# Patient Record
Sex: Female | Born: 1950 | Race: White | Hispanic: No | Marital: Single | Smoking: Never smoker
Health system: Southern US, Community
[De-identification: ages and names within clinical notes are randomized; demographics above are authoritative.]

## PROBLEM LIST (undated history)

## (undated) DIAGNOSIS — E079 Disorder of thyroid, unspecified: Secondary | ICD-10-CM

## (undated) DIAGNOSIS — Z9889 Other specified postprocedural states: Secondary | ICD-10-CM

## (undated) DIAGNOSIS — K635 Polyp of colon: Secondary | ICD-10-CM

## (undated) DIAGNOSIS — F329 Major depressive disorder, single episode, unspecified: Secondary | ICD-10-CM

## (undated) DIAGNOSIS — E039 Hypothyroidism, unspecified: Secondary | ICD-10-CM

## (undated) DIAGNOSIS — K219 Gastro-esophageal reflux disease without esophagitis: Secondary | ICD-10-CM

## (undated) DIAGNOSIS — E789 Disorder of lipoprotein metabolism, unspecified: Secondary | ICD-10-CM

## (undated) DIAGNOSIS — R112 Nausea with vomiting, unspecified: Secondary | ICD-10-CM

## (undated) DIAGNOSIS — F419 Anxiety disorder, unspecified: Secondary | ICD-10-CM

## (undated) DIAGNOSIS — T7840XA Allergy, unspecified, initial encounter: Secondary | ICD-10-CM

## (undated) DIAGNOSIS — E7800 Pure hypercholesterolemia, unspecified: Secondary | ICD-10-CM

## (undated) DIAGNOSIS — K220 Achalasia of cardia: Secondary | ICD-10-CM

## (undated) DIAGNOSIS — F32A Depression, unspecified: Secondary | ICD-10-CM

## (undated) HISTORY — PX: BREAST SURGERY: SHX581

## (undated) HISTORY — DX: Disorder of thyroid, unspecified: E07.9

## (undated) HISTORY — DX: Polyp of colon: K63.5

## (undated) HISTORY — PX: BREAST EXCISIONAL BIOPSY: SUR124

## (undated) HISTORY — DX: Pure hypercholesterolemia, unspecified: E78.00

## (undated) HISTORY — PX: OTHER SURGICAL HISTORY: SHX169

## (undated) HISTORY — PX: EYE SURGERY: SHX253

## (undated) HISTORY — DX: Depression, unspecified: F32.A

## (undated) HISTORY — DX: Anxiety disorder, unspecified: F41.9

## (undated) HISTORY — DX: Achalasia of cardia: K22.0

## (undated) HISTORY — PX: WISDOM TOOTH EXTRACTION: SHX21

## (undated) HISTORY — DX: Allergy, unspecified, initial encounter: T78.40XA

## (undated) HISTORY — DX: Major depressive disorder, single episode, unspecified: F32.9

## (undated) HISTORY — DX: Disorder of lipoprotein metabolism, unspecified: E78.9

## (undated) HISTORY — PX: ESOPHAGOGASTRODUODENOSCOPY (EGD) WITH ESOPHAGEAL DILATION: SHX5812

---

## 1999-03-08 ENCOUNTER — Other Ambulatory Visit: Admission: RE | Admit: 1999-03-08 | Discharge: 1999-03-08 | Payer: Self-pay | Admitting: Internal Medicine

## 2000-07-22 ENCOUNTER — Other Ambulatory Visit: Admission: RE | Admit: 2000-07-22 | Discharge: 2000-07-22 | Payer: Self-pay | Admitting: Obstetrics and Gynecology

## 2001-09-30 ENCOUNTER — Other Ambulatory Visit: Admission: RE | Admit: 2001-09-30 | Discharge: 2001-09-30 | Payer: Self-pay | Admitting: Obstetrics and Gynecology

## 2002-10-29 ENCOUNTER — Other Ambulatory Visit: Admission: RE | Admit: 2002-10-29 | Discharge: 2002-10-29 | Payer: Self-pay | Admitting: Obstetrics and Gynecology

## 2004-02-21 ENCOUNTER — Other Ambulatory Visit: Admission: RE | Admit: 2004-02-21 | Discharge: 2004-02-21 | Payer: Self-pay | Admitting: Obstetrics and Gynecology

## 2004-04-11 ENCOUNTER — Ambulatory Visit (HOSPITAL_COMMUNITY): Admission: RE | Admit: 2004-04-11 | Discharge: 2004-04-11 | Payer: Self-pay | Admitting: Obstetrics and Gynecology

## 2004-04-11 ENCOUNTER — Ambulatory Visit (HOSPITAL_BASED_OUTPATIENT_CLINIC_OR_DEPARTMENT_OTHER): Admission: RE | Admit: 2004-04-11 | Discharge: 2004-04-11 | Payer: Self-pay | Admitting: Obstetrics and Gynecology

## 2004-04-11 ENCOUNTER — Encounter (INDEPENDENT_AMBULATORY_CARE_PROVIDER_SITE_OTHER): Payer: Self-pay | Admitting: *Deleted

## 2004-08-14 ENCOUNTER — Encounter: Admission: RE | Admit: 2004-08-14 | Discharge: 2004-08-14 | Payer: Self-pay | Admitting: Internal Medicine

## 2005-07-12 ENCOUNTER — Encounter: Admission: RE | Admit: 2005-07-12 | Discharge: 2005-07-12 | Payer: Self-pay | Admitting: Obstetrics and Gynecology

## 2005-07-31 ENCOUNTER — Encounter: Admission: RE | Admit: 2005-07-31 | Discharge: 2005-07-31 | Payer: Self-pay | Admitting: Obstetrics and Gynecology

## 2005-08-21 ENCOUNTER — Encounter: Admission: RE | Admit: 2005-08-21 | Discharge: 2005-08-21 | Payer: Self-pay | Admitting: Obstetrics and Gynecology

## 2006-01-22 ENCOUNTER — Other Ambulatory Visit: Admission: RE | Admit: 2006-01-22 | Discharge: 2006-01-22 | Payer: Self-pay | Admitting: Internal Medicine

## 2006-02-06 ENCOUNTER — Encounter: Admission: RE | Admit: 2006-02-06 | Discharge: 2006-02-06 | Payer: Self-pay | Admitting: Internal Medicine

## 2006-02-11 ENCOUNTER — Encounter: Admission: RE | Admit: 2006-02-11 | Discharge: 2006-02-11 | Payer: Self-pay | Admitting: Gastroenterology

## 2006-03-18 ENCOUNTER — Ambulatory Visit (HOSPITAL_COMMUNITY): Admission: RE | Admit: 2006-03-18 | Discharge: 2006-03-18 | Payer: Self-pay | Admitting: Gastroenterology

## 2006-04-17 ENCOUNTER — Encounter (INDEPENDENT_AMBULATORY_CARE_PROVIDER_SITE_OTHER): Payer: Self-pay | Admitting: Specialist

## 2006-04-17 ENCOUNTER — Encounter: Admission: RE | Admit: 2006-04-17 | Discharge: 2006-04-17 | Payer: Self-pay | Admitting: Internal Medicine

## 2006-04-17 ENCOUNTER — Other Ambulatory Visit: Admission: RE | Admit: 2006-04-17 | Discharge: 2006-04-17 | Payer: Self-pay | Admitting: Diagnostic Radiology

## 2007-01-20 ENCOUNTER — Encounter: Admission: RE | Admit: 2007-01-20 | Discharge: 2007-01-20 | Payer: Self-pay | Admitting: Family Medicine

## 2007-02-06 ENCOUNTER — Encounter: Admission: RE | Admit: 2007-02-06 | Discharge: 2007-02-06 | Payer: Self-pay | Admitting: Family Medicine

## 2008-05-21 ENCOUNTER — Encounter: Admission: RE | Admit: 2008-05-21 | Discharge: 2008-05-21 | Payer: Self-pay | Admitting: Family Medicine

## 2008-05-27 ENCOUNTER — Other Ambulatory Visit: Admission: RE | Admit: 2008-05-27 | Discharge: 2008-05-27 | Payer: Self-pay | Admitting: Family Medicine

## 2009-09-05 ENCOUNTER — Encounter: Admission: RE | Admit: 2009-09-05 | Discharge: 2009-09-05 | Payer: Self-pay | Admitting: Family Medicine

## 2010-10-01 ENCOUNTER — Encounter: Payer: Self-pay | Admitting: Internal Medicine

## 2010-11-06 ENCOUNTER — Ambulatory Visit (INDEPENDENT_AMBULATORY_CARE_PROVIDER_SITE_OTHER): Payer: BC Managed Care – PPO | Admitting: Family Medicine

## 2010-11-06 DIAGNOSIS — E039 Hypothyroidism, unspecified: Secondary | ICD-10-CM

## 2010-11-06 DIAGNOSIS — Z79899 Other long term (current) drug therapy: Secondary | ICD-10-CM

## 2010-11-06 DIAGNOSIS — R5381 Other malaise: Secondary | ICD-10-CM

## 2010-11-06 DIAGNOSIS — E7800 Pure hypercholesterolemia, unspecified: Secondary | ICD-10-CM

## 2010-11-06 DIAGNOSIS — Z23 Encounter for immunization: Secondary | ICD-10-CM

## 2011-01-26 NOTE — Op Note (Signed)
NAMEKHAMILA, Cunningham                         ACCOUNT NO.:  [id number]   MEDICAL RECORD NO.:  [id number]                   PATIENT TYPE:  AMB   LOCATION:  [facility]                                 FACILITY:  [facility]   PHYSICIAN:  Laqueta Linden, M.D.                 DATE OF BIRTH:  1951-06-07   DATE OF PROCEDURE:  04/11/2004  DATE OF DISCHARGE:                                 OPERATIVE REPORT   PREOPERATIVE DIAGNOSES:  Perimenopausal menorrhagia, submucosal intramural  and serosal fibroids.   POSTOPERATIVE DIAGNOSES:  Perimenopausal menorrhagia, submucosal intramural  and serosal fibroids.   PROCEDURE:  Hydrothermal ablation with endometrial curettage.   SURGEON:  Laqueta Linden, M.D.   ANESTHESIA:  General LMA.   ESTIMATED BLOOD LOSS:  Less than 30 mL.   SORBITOL NET INTAKE:  Zero.   SPECIMENS:  Endometrial curettings sent to pathology.   COMPLICATIONS:  None.   INDICATIONS FOR PROCEDURE:  Olivia Cunningham is a 60 year old, nulligravida,  perimenopausal female who has done well on low dose birth control pills  until the past year when she reported that her withdrawal bleeds have become  progressively heavier with worsening clots and cramping, change a tampon and  pad every hour for up to 3-4 days each cycle. She has also had some  breakthrough bleeding up to seven days per cycle. She underwent office  evaluation which included endometrial sampling which revealed endometrial  polyp and weekly secretory benign endometrium.  Pelvis ultrasound with  sonohysterogram revealed an enlarged uterus with multiple serosal and  intramural fibroids with the largest measuring 3.7 x 3.9 cm, both ovaries  were normal.  Sonohysterogram confirmed the presence of two submucosal  fibroids, an anterior 7 x 5 mm and a posterior 2.1 x 1.7 cm posterior fundal  submucosal fibroid that appeared to be 50% submucosal. Of note, the  endometrial stripe was thin at 2.8 mm consistent with her oral contraceptive  use. At this point, she had begun taking her pills twice daily in order to  stop the perfuse breakthrough bleeding. Due to her endometrium already being  appropriately thin, she was felt to be a good candidate for a hydrothermal  ablation procedure.  Other options including hysterectomy versus  hysteroscopic resection of the fibroids were discussed with the patient. She  elected to proceed with HTA with curettage as indicated. She has seen the  operative hysteroscopy informed consent films, understands and voices her  acceptance of all the risks, benefits, alternatives, and complications  including but not limited to anesthesia, risk of infection, bleeding  possibly requiring transfusion, uterine perforation, possibility of  incomplete or only temporary relief of her symptoms and the possibility that  the fibroids could regrow and/or the other fibroids that are intramural and  serosal could grow and become more symptomatic requiring more definitive  surgery. She has voiced her understanding and acceptance of all these risks  and limitations  and agrees to proceed. She has continued her twice a day  birth control pills up until the time of surgery. She received Toradol 30 mg  IV in route to the operating room and another 30 mg IM after anesthesia was  introduced.   DESCRIPTION OF PROCEDURE:  The patient was taken to the operating room and  after proper identification and consents were ascertained, she was placed on  the operating table in supine position. After general LMA was introduced,  she was placed in the [place] stirrups and the perineum and vagina were  prepped and draped in routine sterile fashion.  The bladder was emptied with  a red rubber catheter. The uterus was noted to be anterior to mid plane, 10-  12 week size and quite irregular consistent with the known multiple  fibroids.  It was freely mobile. The cervix was grasped with a single tooth  tenaculum and the internal os gently  dilated to a #21 Pratt dilator. The HTA  scope with continuous sorbitol infusion was then inserted into the  endometrial cavity, the fit was felt to be snug at the internal os. Just  inside the internal os, there was a polyp protruding from the lower uterine  segment at approximately 12 o'clock.  A little farther in, there were two  submucosal fibroids, one which appeared to be at least 50% submucosal on the  right posterior fundus and one that was not quite as far into the cavity on  the left posterior uterine wall. The tubal ostia were visualized, the  remainder of the endometrial cavity was thin and atrophic with no other  focal lesions identified. The HTA machine was then placed on routine  settings and the fluid was introduced. A pressure check was intact revealing  no evidence of perforation. The fluid was then heated to 90 degrees Celsius  and a 10 minute endometrial ablative procedure was performed. There was a 3  mL drop in fluid volume felt to be related to uterine expansion with no  obvious leakage noted. The fluid was then cooled at the end of the 10 minute  ablation and the scope was moved around in the cavity. There appeared to be  excellent blanching of all tissues including the surfaces of both fibroids  as well as the tiny polyp on the anterior wall. The scope was then  withdrawn. The polyp forceps were introduced and the polyp was easily  avulsed and sent to pathology. Sharp curettage productive of a moderate  amount of tissue from the posterior uterine wall was also performed.  All  instruments were then removed. The tenaculum site was hemostatic, there was  no active bleeding from the cervical os. Net sorbitol intake zero.  Estimated blood loss less than 30 mL. Specimen sent to pathology. The  patient was stable on transfer to the recovery room.  She received her  Toradol 30 mg IV, 30 mg IM. She will take Advil or Aleve as needed for additional cramping and to stop her  birth control pills as of this time. She  is to followup in the office in two weeks time or sooner for excessive pain,  fever, bleeding or other concerns.                                               Laqueta Linden, M.D.  LKS/MEDQ  D:  04/11/2004  T:  04/11/2004  Job:  [id number]   cc:   Darius Bump, M.D.  [place] [address]  [place] [address]  Fax: [phone]

## 2011-05-01 ENCOUNTER — Encounter: Payer: Self-pay | Admitting: Family Medicine

## 2011-05-04 ENCOUNTER — Other Ambulatory Visit: Payer: BC Managed Care – PPO

## 2011-07-20 ENCOUNTER — Other Ambulatory Visit: Payer: Self-pay | Admitting: Family Medicine

## 2011-07-20 DIAGNOSIS — Z1231 Encounter for screening mammogram for malignant neoplasm of breast: Secondary | ICD-10-CM

## 2011-07-25 LAB — HM COLONOSCOPY

## 2011-07-27 ENCOUNTER — Encounter: Payer: Self-pay | Admitting: Family Medicine

## 2011-08-13 ENCOUNTER — Ambulatory Visit
Admission: RE | Admit: 2011-08-13 | Discharge: 2011-08-13 | Disposition: A | Discharge status: Home / Self Care | Payer: BC Managed Care – PPO | Source: Ambulatory Visit | Attending: Family Medicine | Admitting: Family Medicine

## 2011-08-13 DIAGNOSIS — Z1231 Encounter for screening mammogram for malignant neoplasm of breast: Secondary | ICD-10-CM

## 2012-01-07 ENCOUNTER — Telehealth: Payer: Self-pay | Admitting: Internal Medicine

## 2012-01-07 ENCOUNTER — Other Ambulatory Visit: Payer: Self-pay | Admitting: *Deleted

## 2012-01-07 DIAGNOSIS — Z79899 Other long term (current) drug therapy: Secondary | ICD-10-CM

## 2012-01-07 DIAGNOSIS — E7800 Pure hypercholesterolemia, unspecified: Secondary | ICD-10-CM

## 2012-01-07 DIAGNOSIS — E039 Hypothyroidism, unspecified: Secondary | ICD-10-CM

## 2012-01-07 DIAGNOSIS — F329 Major depressive disorder, single episode, unspecified: Secondary | ICD-10-CM

## 2012-01-07 MED ORDER — SERTRALINE HCL 100 MG PO TABS: 100.0000 mg | ORAL_TABLET | Freq: Every day | ORAL | Status: DC

## 2012-01-07 MED ORDER — SIMVASTATIN 80 MG PO TABS: 80.0000 mg | ORAL_TABLET | Freq: Every day | ORAL | Status: DC

## 2012-01-07 MED ORDER — LEVOTHYROXINE SODIUM 100 MCG PO TABS: 100.0000 ug | ORAL_TABLET | Freq: Every day | ORAL | Status: DC

## 2012-01-07 NOTE — Telephone Encounter (Signed)
Done

## 2012-01-07 NOTE — Telephone Encounter (Signed)
Also levothyroxine #30

## 2012-02-14 ENCOUNTER — Other Ambulatory Visit: Payer: BC Managed Care – PPO

## 2012-02-14 DIAGNOSIS — E7800 Pure hypercholesterolemia, unspecified: Secondary | ICD-10-CM

## 2012-02-14 DIAGNOSIS — Z79899 Other long term (current) drug therapy: Secondary | ICD-10-CM

## 2012-02-14 DIAGNOSIS — E039 Hypothyroidism, unspecified: Secondary | ICD-10-CM

## 2012-02-15 LAB — LIPID PANEL
HDL: 49 mg/dL (ref >39)
LDL Cholesterol: 117 mg/dL — ABNORMAL HIGH (ref 0–99)
Total CHOL/HDL Ratio: 3.9 Ratio
Triglycerides: 121 mg/dL (ref <150)
VLDL: 24 mg/dL (ref 0–40)

## 2012-02-15 LAB — COMPREHENSIVE METABOLIC PANEL
ALT: 9 U/L (ref 0–35)
AST: 16 U/L (ref 0–37)
Albumin: 4.1 g/dL (ref 3.5–5.2)
Alkaline Phosphatase: 47 U/L (ref 39–117)
BUN: 19 mg/dL (ref 6–23)
CO2: 29 mEq/L (ref 19–32)
Chloride: 103 mEq/L (ref 96–112)
Creat: 0.91 mg/dL (ref 0.50–1.10)
Sodium: 139 mEq/L (ref 135–145)
Total Bilirubin: 0.5 mg/dL (ref 0.3–1.2)
Total Protein: 7.1 g/dL (ref 6.0–8.3)

## 2012-02-15 LAB — TSH: TSH: 2.146 u[IU]/mL (ref 0.350–4.500)

## 2012-02-18 ENCOUNTER — Ambulatory Visit (INDEPENDENT_AMBULATORY_CARE_PROVIDER_SITE_OTHER): Payer: BC Managed Care – PPO | Admitting: Family Medicine

## 2012-02-18 ENCOUNTER — Encounter: Payer: Self-pay | Admitting: Family Medicine

## 2012-02-18 VITALS — BP 110/70 | HR 64 | Ht 65.0 in | Wt 150.0 lb

## 2012-02-18 DIAGNOSIS — E7800 Pure hypercholesterolemia, unspecified: Secondary | ICD-10-CM | POA: Insufficient documentation

## 2012-02-18 DIAGNOSIS — F411 Generalized anxiety disorder: Secondary | ICD-10-CM

## 2012-02-18 DIAGNOSIS — E039 Hypothyroidism, unspecified: Secondary | ICD-10-CM | POA: Insufficient documentation

## 2012-02-18 MED ORDER — SERTRALINE HCL 50 MG PO TABS: 50.0000 mg | ORAL_TABLET | Freq: Every day | ORAL | Status: DC

## 2012-02-18 MED ORDER — SIMVASTATIN 80 MG PO TABS: 40.0000 mg | ORAL_TABLET | Freq: Every day | ORAL | Status: DC

## 2012-02-18 MED ORDER — LEVOTHYROXINE SODIUM 100 MCG PO TABS: 100.0000 ug | ORAL_TABLET | Freq: Every day | ORAL | Status: DC

## 2012-02-18 NOTE — Progress Notes (Signed)
Chief Complaint  Patient presents with  . Med check    labs done 02/14/12.   HPI: Patient presents for med check, f/u labs.  Hyperlipidemia:  She cut back to 40mg  of simvastatin after last visit (probably after last labs in 10/2010).  Continues to follow a lowfat, low cholesterol diet and exercise regularly.  Denies side effects of medications.  Anxiety and h/o depression.  Has been doing very well. Cut back to 50mg  dose over a year ago and no increased anxiety.  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  Denies increased anxiety related to job.  Hypothyroid:  Denies any significant symptoms related to her thyroid.  She is working 10 hour days, exercising less, but doesn't report any weight gain.  Slight hair loss which she relates to menopause.   Past Medical History  Diagnosis Date  . Cholesterol serum elevated   . Depression   . Anxiety   . Cancer     BREAST  . Thyroid disease     HYPOTHYROID, NODULE   Past Surgical History  Procedure Date  . Breast surgery     LEFT LUMPECTOMY  . Arthroscopic knee (left)    History   Social History  . Marital Status: Single    Spouse Name: N/A    Number of Children: N/A  . Years of Education: N/A   Occupational History  . Not on file.   Social History Main Topics  . Smoking status: Never Smoker   . Smokeless tobacco: Not on file  . Alcohol Use: Yes     1 glass of wine per week.  . Drug Use: No  . Sexually Active: Not on file   Other Topics Concern  . Not on file   Social History Narrative  . No narrative on file   Current Outpatient Prescriptions on File Prior to Visit  Medication Sig Dispense Refill  . Multiple Vitamin (MULTIVITAMIN) tablet Take 1 tablet by mouth daily.        Marland Kitchen DISCONTD: levothyroxine (SYNTHROID, LEVOTHROID) 100 MCG tablet Take 1 tablet (100 mcg total) by mouth daily.  30 tablet  0  . DISCONTD: sertraline (ZOLOFT) 100 MG tablet  Take 1 tablet (100 mg total) by mouth daily.  30 tablet  0  . DISCONTD: simvastatin (ZOCOR) 80 MG tablet Take 1 tablet (80 mg total) by mouth at bedtime.  30 tablet  0   Allergies  Allergen Reactions  . Demerol Nausea And Vomiting  . Penicillins Rash   ROS:  Denies weight changes, fevers, URI symptoms, chest pain, palpitations, shortness of breath, nausea, vomiting, bowel changes, skin or hair changes (except per HPI).  Denies urinary complaints, skin rash, joint pains or other concerns.  PHYSICAL EXAM: BP 110/70  Pulse 64  Ht 5\' 5"  (1.651 m)  Wt 150 lb (68.04 kg)  BMI 24.96 kg/m2 Well developed, pleasant female in no distress HEENT:  PERRL, conjunctiva clear Neck: no lymphadenopathy, thyromegaly or mass (borderline thyroid size) Heart: regular rate and rhythm without murmur Lungs: clear bilaterally Abdomen: soft, nontender, no organomegaly or mass Extremities: no edema, 2+ pulse Skin: no rash Psych: normal mood, affect, hygiene and grooming   Lab Results  Component Value Date   TSH 2.146 02/14/2012     Chemistry      Component Value Date/Time   NA 139 02/14/2012 0847   K 4.5 02/14/2012 0847   CL 103 02/14/2012 0847  CO2 29 02/14/2012 0847   BUN 19 02/14/2012 0847   CREATININE 0.91 02/14/2012 0847      Component Value Date/Time   CALCIUM 9.3 02/14/2012 0847   ALKPHOS 47 02/14/2012 0847   AST 16 02/14/2012 0847   ALT 9 02/14/2012 0847   BILITOT 0.5 02/14/2012 0847     Lab Results  Component Value Date   CHOL 190 02/14/2012   HDL 49 02/14/2012   LDLCALC 409* 02/14/2012   TRIG 121 02/14/2012   CHOLHDL 3.9 02/14/2012    ASSESSMENT/PLAN:  1. Unspecified hypothyroidism  levothyroxine (SYNTHROID, LEVOTHROID) 100 MCG tablet   adequately replaced  2. Anxiety state, unspecified  sertraline (ZOLOFT) 50 MG tablet   well controlled. considering trying to further decrease dose and/or taper off if continues to do well  3. Pure hypercholesterolemia  simvastatin (ZOCOR) 80 MG tablet   well controlled  on the lower dose of 40mg  daily   Discussed risks/benefits of shingles vaccine, and will return for nurse visit (and will check insurance coverage)  F/u 1 year, sooner prn.

## 2012-02-18 NOTE — Patient Instructions (Signed)
Schedule nurse visit for your shingles vaccine--you can check your insurance coverage in the meantime.  Continue all of your current medications.  You can consider cutting down the sertraline to 1/2 tablet (25 mg) for a month or so, and if still doing very well, then can taper off completely.  If any recurrent symptoms of anxiety or depression, there is no reason that you need to come off of this medication, you can simply go back up to the full 50mg .

## 2012-05-19 ENCOUNTER — Telehealth: Payer: Self-pay | Admitting: *Deleted

## 2012-05-19 DIAGNOSIS — F411 Generalized anxiety disorder: Secondary | ICD-10-CM

## 2012-05-19 MED ORDER — SERTRALINE HCL 100 MG PO TABS: 100.0000 mg | ORAL_TABLET | Freq: Every day | ORAL | Status: DC

## 2012-05-19 NOTE — Telephone Encounter (Signed)
Done.  Advise pt rx sent to pharmacy.,

## 2012-05-19 NOTE — Telephone Encounter (Signed)
When patient was last in for OV her generic zoloft was decreased from 100mg  down to 50mg . Patient states that her short tempered intensity has come back and she would like to go back up to the 100mg  dose. If this is okay she will need rx sent to [facility] for 100mg .

## 2012-05-19 NOTE — Telephone Encounter (Signed)
Pt advised.

## 2012-12-09 ENCOUNTER — Telehealth: Payer: Self-pay | Admitting: Family Medicine

## 2012-12-10 ENCOUNTER — Other Ambulatory Visit: Payer: Self-pay | Admitting: *Deleted

## 2012-12-10 DIAGNOSIS — E7800 Pure hypercholesterolemia, unspecified: Secondary | ICD-10-CM

## 2012-12-10 DIAGNOSIS — E039 Hypothyroidism, unspecified: Secondary | ICD-10-CM

## 2012-12-10 DIAGNOSIS — F411 Generalized anxiety disorder: Secondary | ICD-10-CM

## 2012-12-10 MED ORDER — SIMVASTATIN 80 MG PO TABS: 40.0000 mg | ORAL_TABLET | Freq: Every day | ORAL | Status: DC

## 2012-12-10 MED ORDER — LEVOTHYROXINE SODIUM 100 MCG PO TABS: 100.0000 ug | ORAL_TABLET | Freq: Every day | ORAL | Status: DC

## 2012-12-10 MED ORDER — SERTRALINE HCL 100 MG PO TABS: 100.0000 mg | ORAL_TABLET | Freq: Every day | ORAL | Status: DC

## 2012-12-10 NOTE — Telephone Encounter (Signed)
Done and called patient to let her know that she is due for med check in June 2014.

## 2012-12-11 NOTE — Telephone Encounter (Signed)
LM

## 2013-02-18 ENCOUNTER — Encounter: Payer: Self-pay | Admitting: Family Medicine

## 2013-02-18 ENCOUNTER — Ambulatory Visit (INDEPENDENT_AMBULATORY_CARE_PROVIDER_SITE_OTHER): Payer: BC Managed Care – PPO | Admitting: Family Medicine

## 2013-02-18 VITALS — BP 94/60 | HR 72 | Temp 98.0°F | Ht 64.0 in | Wt 146.0 lb

## 2013-02-18 DIAGNOSIS — E039 Hypothyroidism, unspecified: Secondary | ICD-10-CM

## 2013-02-18 DIAGNOSIS — F411 Generalized anxiety disorder: Secondary | ICD-10-CM

## 2013-02-18 DIAGNOSIS — R5383 Other fatigue: Secondary | ICD-10-CM

## 2013-02-18 DIAGNOSIS — E042 Nontoxic multinodular goiter: Secondary | ICD-10-CM

## 2013-02-18 DIAGNOSIS — R5381 Other malaise: Secondary | ICD-10-CM

## 2013-02-18 DIAGNOSIS — E7800 Pure hypercholesterolemia, unspecified: Secondary | ICD-10-CM

## 2013-02-18 LAB — CBC WITH DIFFERENTIAL/PLATELET
Basophils Absolute: 0 10*3/uL (ref 0.0–0.1)
Basophils Relative: 1 % (ref 0–1)
Eosinophils Absolute: 0.2 10*3/uL (ref 0.0–0.7)
MCH: 30.4 pg (ref 26.0–34.0)
MCHC: 33.6 g/dL (ref 30.0–36.0)
Neutro Abs: 2.3 10*3/uL (ref 1.7–7.7)
Neutrophils Relative %: 50 % (ref 43–77)
Platelets: 228 10*3/uL (ref 150–400)

## 2013-02-18 NOTE — Progress Notes (Signed)
Chief Complaint  Patient presents with  . Hypertension    nonfasting med check, labs drawn this morning.  . Depression  . Hypothyroidism  . Cough    woke up with ST Thursday, ST went away now she is coughing, some sinus drainage but mostly in her chest, and some fatigue.    Hypothyroidism:  Compliant with medications, taking on empty stomach, not with food or other medications/vitamins.  Denies any symptoms--just mild hair loss that she relates to menopause.  No skin/bowel changes.  She has lost 8 pounds since working at current job--more active during the day, and busier so eating less.  Hyperlipidemia follow-up:  Patient is reportedly following a low-fat, low cholesterol diet.  Compliant with medications and denies medication side effects  Depression/Anxiety:  She increased the dose of zoloft in November or December when she was more irritable, short-tempered.  Has some stress at work, not working well with the [employer].  Since dose increased, she is doing much better.  She went to [place] for 2 weeks over Passover, and feels much calmer overall.  She reports never getting sick, but then had 1 stomach bug and 3 colds in the last 6-7 months.  ?exposure from [employer] students (touching doorknobs).  She currently is sick-- 6 days ago awoke with a sore throat.  Had only slight head congestion, but having a "crackly" cough and chest congestion.  She got an OTC mucinex-like med which helps some.  Not really getting up any phlegm (felt like she needs to).  Nasal mucus is clear.  Denies fevers.  Sore throat resolved after 2 days. +complaining of fatigue--working some 12 hour days, and related to current illness.  Past Medical History  Diagnosis Date  . Cholesterol serum elevated   . Depression   . Anxiety   . Thyroid disease     HYPOTHYROID, NODULE  . Achalasia   . Colon polyp     Dr. Loreta Ave; last colonoscopy 07/2011 normal (due again 2017)   Past Surgical History  Procedure  Laterality Date  . Breast surgery      LEFT LUMPECTOMY (benign)  . Arthroscopic knee (left)    . Esophagogastroduodenoscopy (egd) with esophageal dilation  early 1980's    x3 at [facility]   History   Social History  . Marital Status: Single    Spouse Name: N/A    Number of Children: N/A  . Years of Education: N/A   Occupational History  . Librarian, academic at [employer]    Social History Main Topics  . Smoking status: Never Smoker   . Smokeless tobacco: Never Used  . Alcohol Use: Yes     Comment: 0-1 glass of wine per week.  . Drug Use: No  . Sexually Active: Not Currently   Other Topics Concern  . Not on file   Social History Narrative   Lives alone   Family History  Problem Relation Age of Onset  . Hypertension Mother   . Heart disease Mother     CHF; CABG and valve replacement  . Arthritis Father   . Heart disease Father     MI at 48, CABG at 67  . Breast cancer Father     late 40's/early 34's (premenopausal)  . Cancer Sister     BREAST  . Breast cancer Sister   . Heart disease Brother   . Bipolar disorder Brother   . Hyperlipidemia Brother   . Heart disease Maternal Grandmother   .  Heart disease Maternal Grandfather   . Heart disease Paternal Grandmother   . Heart disease Paternal Grandfather   . Colon cancer Neg Hx    Current outpatient prescriptions:aspirin 81 MG tablet, Take 81 mg by mouth daily., Disp: , Rfl: ;  Calcium-Magnesium-Vitamin D (CALCIUM 500 PO), Take 1 tablet by mouth daily., Disp: , Rfl: ;  Coenzyme Q10 (CO Q-10) 300 MG CAPS, Take by mouth., Disp: , Rfl: ;  levothyroxine (SYNTHROID, LEVOTHROID) 100 MCG tablet, Take 1 tablet (100 mcg total) by mouth daily., Disp: 90 tablet, Rfl: 0 Multiple Vitamin (MULTIVITAMIN) tablet, Take 1 tablet by mouth daily.  , Disp: , Rfl: ;  sertraline (ZOLOFT) 100 MG tablet, Take 1 tablet (100 mg total) by mouth daily., Disp: 90 tablet, Rfl: 0;  simvastatin (ZOCOR) 80 MG tablet, Take 0.5 tablets (40 mg  total) by mouth at bedtime., Disp: 90 tablet, Rfl: 0  Allergies  Allergen Reactions  . Demerol Nausea And Vomiting  . Penicillins Rash   ROS:  Denies fevers, shortness of breath, chest pain, palpitations, nausea, vomiting, diarrhea, skin rashes, bleeding/bruising.  +weight loss, some fatigue, cough.  See HPI.  PHYSICAL EXAM: BP 94/60  Pulse 72  Temp(Src) 98 F (36.7 C) (Oral)  Ht 5\' 4"  (1.626 m)  Wt 146 lb (66.225 kg)  BMI 25.05 kg/m2 Well developed, pleasant female in no distress.  Some head congestion; not coughing HEENT:  PERRL, conjunctiva clear.  TM's and EAC's normal.  Nasal mucosa mildly edematous, no erythema, no purulence.  Clear mucus present.  Sinuses nontender.  OP clear Neck: no lymphadenopathy. =Goiter, with nodules, more prominent on right than left. Nontender. Heart: regular rate and rhythm without murmur Lungs: clear bilaterally.  Good air movement. No wheezes, rales or ronchi Skin: no rash Psych: normal mood, affect, hygiene and grooming Neuro: alert and oriented. CN intact. Normal gait, strength.  ASSESSMENT/PLAN:  Anxiety state, unspecified - improved on 100mg  Zoloft.  discussed stress reduction, and consider counseling vs further increasing dose if stress/anxiety worsens.  Unspecified hypothyroidism - euthyroid by history.  check TSH today - Plan: TSH  Pure hypercholesterolemia - Plan: Comprehensive metabolic panel, Lipid panel  Other malaise and fatigue - Plan: TSH, Comprehensive metabolic panel, CBC with Differential, Vitamin D 25 hydroxy  Multiple thyroid nodules - last u/s 5/08.  check u/s to eval for any significant changes.  consider increasing thyroid dose to be more suppressive (if TSH >2-3, especially if inc size)  - Plan: US Soft Tissue Head/Neck    DO ALL REFILLS AFTER LABS BACK

## 2013-02-18 NOTE — Patient Instructions (Addendum)
Continue your current medications. Consider trying claritin or zyrtec to help with the nasal congestion (and likely will help with the cough).  Continue the guaifenesin.  Call or return if increasing cough, discolored mucus, fevers develop

## 2013-02-19 ENCOUNTER — Encounter: Payer: Self-pay | Admitting: Family Medicine

## 2013-02-19 ENCOUNTER — Other Ambulatory Visit: Payer: BC Managed Care – PPO

## 2013-02-19 ENCOUNTER — Ambulatory Visit
Admission: RE | Admit: 2013-02-19 | Discharge: 2013-02-19 | Disposition: A | Discharge status: Home / Self Care | Payer: BC Managed Care – PPO | Source: Ambulatory Visit | Attending: Family Medicine | Admitting: Family Medicine

## 2013-02-19 DIAGNOSIS — E042 Nontoxic multinodular goiter: Secondary | ICD-10-CM

## 2013-02-19 LAB — COMPREHENSIVE METABOLIC PANEL
ALT: 10 U/L (ref 0–35)
Alkaline Phosphatase: 45 U/L (ref 39–117)
CO2: 28 mEq/L (ref 19–32)
Creat: 0.84 mg/dL (ref 0.50–1.10)
Glucose, Bld: 87 mg/dL (ref 70–99)
Sodium: 139 mEq/L (ref 135–145)
Total Bilirubin: 0.4 mg/dL (ref 0.3–1.2)
Total Protein: 6.8 g/dL (ref 6.0–8.3)

## 2013-02-19 LAB — LIPID PANEL
Cholesterol: 177 mg/dL (ref 0–200)
LDL Cholesterol: 107 mg/dL — ABNORMAL HIGH (ref 0–99)
Total CHOL/HDL Ratio: 3.8 Ratio
Triglycerides: 120 mg/dL (ref <150)
VLDL: 24 mg/dL (ref 0–40)

## 2013-02-19 LAB — VITAMIN D 25 HYDROXY (VIT D DEFICIENCY, FRACTURES): Vit D, 25-Hydroxy: 40 ng/mL (ref 30–89)

## 2013-02-19 LAB — TSH: TSH: 3.142 u[IU]/mL (ref 0.350–4.500)

## 2013-02-19 MED ORDER — SERTRALINE HCL 100 MG PO TABS: 100.0000 mg | ORAL_TABLET | Freq: Every day | ORAL | Status: DC

## 2013-02-19 MED ORDER — SIMVASTATIN 80 MG PO TABS: 40.0000 mg | ORAL_TABLET | Freq: Every day | ORAL | Status: DC

## 2013-02-20 MED ORDER — LEVOTHYROXINE SODIUM 112 MCG PO TABS: 112.0000 ug | ORAL_TABLET | Freq: Every day | ORAL | Status: DC

## 2013-02-20 NOTE — Addendum Note (Signed)
Addended byJoselyn Arrow on: 02/20/2013 04:05 PM   Modules accepted: Orders

## 2013-03-16 ENCOUNTER — Encounter: Payer: Self-pay | Admitting: Family Medicine

## 2013-03-16 ENCOUNTER — Ambulatory Visit (INDEPENDENT_AMBULATORY_CARE_PROVIDER_SITE_OTHER): Payer: BC Managed Care – PPO | Admitting: Family Medicine

## 2013-03-16 VITALS — BP 112/68 | HR 80 | Temp 99.8°F | Ht 64.0 in | Wt 144.0 lb

## 2013-03-16 DIAGNOSIS — B349 Viral infection, unspecified: Secondary | ICD-10-CM

## 2013-03-16 DIAGNOSIS — B9789 Other viral agents as the cause of diseases classified elsewhere: Secondary | ICD-10-CM

## 2013-03-16 NOTE — Patient Instructions (Addendum)
Return if persistent/worsening fevers, headaches, any neurologic symptoms develop (numbness/tingling/weakness).   Call us with update later this week if not improving. Drink plenty of fluids, get plenty of rest, and use tylenol and/or ibuprofen or aleve as needed for pain/fever

## 2013-03-16 NOTE — Progress Notes (Signed)
Chief Complaint  Patient presents with  . Advice Only    went to bed Sat night, couldn't sleep well, achy and had a low grade fever. Last night even worse. Very achy, HA, sore neck and some head congestion. Remembered [place] and did get a lot of mosquito bites, wonders if she may have West Nile Virus.    She couldn't sleep last night due to aching so much.  Aching in legs, arms, headache, neck.  She took Aleve this morning, helping a little.  Yesterday she took 600mg  of ibuprofen which helped some.  T99.8 this morning prior to Aleve.  She recalls many mosquito bites while in mountains 2 weeks ago. No known tick bites.  No rashes.  Denies other sick contacts. Some mild nausea, no vomiting.  Bowels are normal, no diarrhea. Some runny nose and nausea.  No sore throat, cough, shortness of breath  Past Medical History  Diagnosis Date  . Cholesterol serum elevated   . Depression   . Anxiety   . Thyroid disease     HYPOTHYROID, NODULE  . Achalasia   . Colon polyp     Dr. Loreta Ave; last colonoscopy 07/2011 normal (due again 2017)   Past Surgical History  Procedure Laterality Date  . Breast surgery      LEFT LUMPECTOMY (benign)  . Arthroscopic knee (left)    . Esophagogastroduodenoscopy (egd) with esophageal dilation  early 1980's    x3 at [facility]   History   Social History  . Marital Status: Single    Spouse Name: N/A    Number of Children: N/A  . Years of Education: N/A   Occupational History  . Librarian, academic at [employer]    Social History Main Topics  . Smoking status: Never Smoker   . Smokeless tobacco: Never Used  . Alcohol Use: Yes     Comment: 0-1 glass of wine per week.  . Drug Use: No  . Sexually Active: Not Currently   Other Topics Concern  . Not on file   Social History Narrative   Lives alone   Current Outpatient Prescriptions on File Prior to Visit  Medication Sig Dispense Refill  . aspirin 81 MG tablet Take  81 mg by mouth daily.      . Calcium-Magnesium-Vitamin D (CALCIUM 500 PO) Take 1 tablet by mouth daily.      . Coenzyme Q10 (CO Q-10) 300 MG CAPS Take by mouth.      . levothyroxine (SYNTHROID, LEVOTHROID) 112 MCG tablet Take 1 tablet (112 mcg total) by mouth daily.  30 tablet  1  . Multiple Vitamin (MULTIVITAMIN) tablet Take 1 tablet by mouth daily.        . sertraline (ZOLOFT) 100 MG tablet Take 1 tablet (100 mg total) by mouth daily.  90 tablet  3  . simvastatin (ZOCOR) 80 MG tablet Take 0.5 tablets (40 mg total) by mouth at bedtime.  90 tablet  3   No current facility-administered medications on file prior to visit.   Allergies  Allergen Reactions  . Demerol Nausea And Vomiting  . Penicillins Rash   ROS:  +low grade fevers, myalgias, fatigue, headache, decreased appetite, mild nausea.  No vomiting, diarrhea, skin rash, chest pain, palpitations.  No urinary complaints, no numbness/tingling, weakness. Stress recently decreased.  No other concerns  PHYSICAL EXAM: BP 112/68  Pulse 80  Temp(Src) 99.8 F (37.7 C) (Tympanic)  Ht 5\' 4"  (1.626 m)  Wt 144 lb (65.318 kg)  BMI 24.71 kg/m2 Well developed, pleasant, not particularly ill-appearing female in no distress HEENT:  PERRL, EOMI, conjunctiva clear. TM's and EAC's normal.  Nasal mucosa moderately edematous, not erythematous, no purulence.  Sinuses nontender, OP clear Neck: no lymphadenopathy or mass Heart: regular rate and rhythm without murmur Lungs: clear bilaterally Abdomen: soft; mild epigastric tenderness. no organomegaly or mass Back: no spine or CVA tenderness.  Extremities: no edema Skin: no rashes, bites, or other lesions Psych: normal mood, affect, hygiene and grooming   ASSESSMENT/PLAN:  Viral syndrome  Supportive measures reviewed. Return if persistent/worsening symptoms--further eval might be needed, including labs.  Reassured that at this point it looks like a fairly typical viral syndrome--might develop more  URI symptoms, vs more GI symptoms.  Return for re-eval prn.  No alarming symptoms today

## 2013-03-19 ENCOUNTER — Encounter: Payer: Self-pay | Admitting: Family Medicine

## 2013-04-30 ENCOUNTER — Other Ambulatory Visit: Payer: Self-pay | Admitting: Family Medicine

## 2013-07-16 ENCOUNTER — Other Ambulatory Visit: Payer: Self-pay

## 2013-08-10 ENCOUNTER — Other Ambulatory Visit: Payer: Self-pay

## 2013-08-10 DIAGNOSIS — Z1231 Encounter for screening mammogram for malignant neoplasm of breast: Secondary | ICD-10-CM

## 2013-08-23 ENCOUNTER — Encounter: Payer: Self-pay | Admitting: Family Medicine

## 2013-08-28 ENCOUNTER — Other Ambulatory Visit: Payer: Self-pay | Admitting: Family Medicine

## 2013-08-28 ENCOUNTER — Other Ambulatory Visit: Payer: Self-pay

## 2013-08-28 DIAGNOSIS — R7989 Other specified abnormal findings of blood chemistry: Secondary | ICD-10-CM

## 2013-08-28 NOTE — Telephone Encounter (Signed)
IS THIS OKAY 

## 2013-08-28 NOTE — Telephone Encounter (Signed)
She was supposed to have increased to the 112 dose in June, after her ultrasound, and was supposed to return in 6 weeks to have TSH rechecked.  Did she ever increase the dose? Or has she still been taking the 100?  I see that she has f/u ultrasound scheduled in January.  I need to know which thyroid dose she has been taking (plan was to increase to 112, and repeat TSH in 6 weeks, not sure if dose was changed, she definitely didn't come for recheck TSH, and is now requesting the refill on the lower dose)

## 2013-08-28 NOTE — Telephone Encounter (Signed)
TALKED WITH PT SHE SAID SHE FOR GOT ABOUT COMING BACK SHE TOOK THE 112 MCG AND WENT BACK TO 100 MCG SHE IS COMING IN Monday FOR TSH AND I DENIED MED. TODAY AND I TOLD HER SHE WOULD NEED APPOINTMENT TO SEE YOU BUT TO SCHEDULE IT ON Monday PT VERBALIZED UNDERSTANDING

## 2013-08-31 ENCOUNTER — Other Ambulatory Visit: Payer: BC Managed Care – PPO

## 2013-08-31 ENCOUNTER — Telehealth: Payer: Self-pay | Admitting: *Deleted

## 2013-08-31 DIAGNOSIS — R7989 Other specified abnormal findings of blood chemistry: Secondary | ICD-10-CM

## 2013-08-31 LAB — TSH: TSH: 2.818 u[IU]/mL (ref 0.350–4.500)

## 2013-08-31 NOTE — Telephone Encounter (Signed)
Can you call pt and see how long she has been back on the 100--trying to see if we should do TSH now or not (or if I should put her back on the 112 for 6 wks).  If she has been back on the 100 for over 6-8 wks, then can see if there is the same wiggle room to put her back up to the 112.

## 2013-08-31 NOTE — Telephone Encounter (Signed)
Spoke with patient and she states that she took the of Synthroid until the rx ran out probably in late Aug, early Sept. Started taking the about a week ago so was without any med for several months. She said she has learned her lesson since she saw Dr.Siebert for trigger finger and he told her that it is common for people with thyroid problems to have this condition so she will be better about taking her meds in the future.

## 2013-09-01 ENCOUNTER — Other Ambulatory Visit: Payer: Self-pay | Admitting: Family Medicine

## 2013-09-01 DIAGNOSIS — E039 Hypothyroidism, unspecified: Secondary | ICD-10-CM

## 2013-09-01 MED ORDER — LEVOTHYROXINE SODIUM 112 MCG PO TABS: 112.0000 ug | ORAL_TABLET | Freq: Every day | ORAL | Status: DC

## 2013-09-02 ENCOUNTER — Other Ambulatory Visit: Payer: Self-pay | Admitting: *Deleted

## 2013-09-02 DIAGNOSIS — E039 Hypothyroidism, unspecified: Secondary | ICD-10-CM

## 2013-09-14 ENCOUNTER — Ambulatory Visit
Admission: RE | Admit: 2013-09-14 | Discharge: 2013-09-14 | Disposition: A | Discharge status: Home / Self Care | Payer: BC Managed Care – PPO | Source: Ambulatory Visit

## 2013-09-14 DIAGNOSIS — Z1231 Encounter for screening mammogram for malignant neoplasm of breast: Secondary | ICD-10-CM

## 2013-10-14 ENCOUNTER — Encounter: Payer: Self-pay | Admitting: Family Medicine

## 2013-10-15 ENCOUNTER — Other Ambulatory Visit: Payer: Self-pay | Admitting: Family Medicine

## 2013-10-15 ENCOUNTER — Other Ambulatory Visit: Payer: BC Managed Care – PPO

## 2013-10-15 DIAGNOSIS — E039 Hypothyroidism, unspecified: Secondary | ICD-10-CM

## 2013-10-15 LAB — TSH: TSH: 1.701 u[IU]/mL (ref 0.350–4.500)

## 2013-10-15 MED ORDER — LEVOTHYROXINE SODIUM 112 MCG PO TABS: 112.0000 ug | ORAL_TABLET | Freq: Every day | ORAL | Status: DC

## 2013-12-05 ENCOUNTER — Other Ambulatory Visit: Payer: Self-pay | Admitting: Family Medicine

## 2013-12-06 ENCOUNTER — Encounter: Payer: Self-pay | Admitting: Family Medicine

## 2013-12-07 ENCOUNTER — Other Ambulatory Visit: Payer: Self-pay | Admitting: *Deleted

## 2013-12-07 DIAGNOSIS — F411 Generalized anxiety disorder: Secondary | ICD-10-CM

## 2013-12-07 DIAGNOSIS — E039 Hypothyroidism, unspecified: Secondary | ICD-10-CM

## 2013-12-07 DIAGNOSIS — E7800 Pure hypercholesterolemia, unspecified: Secondary | ICD-10-CM

## 2013-12-07 MED ORDER — LEVOTHYROXINE SODIUM 112 MCG PO TABS: 112.0000 ug | ORAL_TABLET | Freq: Every day | ORAL | Status: DC

## 2013-12-07 MED ORDER — SERTRALINE HCL 100 MG PO TABS: 100.0000 mg | ORAL_TABLET | Freq: Every day | ORAL | Status: DC

## 2013-12-07 MED ORDER — SIMVASTATIN 80 MG PO TABS: 40.0000 mg | ORAL_TABLET | Freq: Every day | ORAL | Status: DC

## 2014-02-22 ENCOUNTER — Other Ambulatory Visit (HOSPITAL_COMMUNITY)
Admission: RE | Admit: 2014-02-22 | Discharge: 2014-02-22 | Disposition: A | Discharge status: Home / Self Care | Payer: BC Managed Care – PPO | Source: Ambulatory Visit | Attending: Family Medicine | Admitting: Family Medicine

## 2014-02-22 ENCOUNTER — Ambulatory Visit (INDEPENDENT_AMBULATORY_CARE_PROVIDER_SITE_OTHER): Payer: BC Managed Care – PPO | Admitting: Family Medicine

## 2014-02-22 ENCOUNTER — Encounter: Payer: Self-pay | Admitting: Family Medicine

## 2014-02-22 VITALS — BP 100/64 | HR 84 | Ht 64.5 in | Wt 150.0 lb

## 2014-02-22 DIAGNOSIS — Z79899 Other long term (current) drug therapy: Secondary | ICD-10-CM

## 2014-02-22 DIAGNOSIS — R0683 Snoring: Secondary | ICD-10-CM

## 2014-02-22 DIAGNOSIS — R0989 Other specified symptoms and signs involving the circulatory and respiratory systems: Secondary | ICD-10-CM

## 2014-02-22 DIAGNOSIS — E039 Hypothyroidism, unspecified: Secondary | ICD-10-CM

## 2014-02-22 DIAGNOSIS — Z0000 Encounter for general adult medical examination without abnormal findings: Secondary | ICD-10-CM

## 2014-02-22 DIAGNOSIS — E7800 Pure hypercholesterolemia, unspecified: Secondary | ICD-10-CM

## 2014-02-22 DIAGNOSIS — R0609 Other forms of dyspnea: Secondary | ICD-10-CM

## 2014-02-22 DIAGNOSIS — Z1151 Encounter for screening for human papillomavirus (HPV): Secondary | ICD-10-CM | POA: Insufficient documentation

## 2014-02-22 DIAGNOSIS — F411 Generalized anxiety disorder: Secondary | ICD-10-CM

## 2014-02-22 DIAGNOSIS — Z01419 Encounter for gynecological examination (general) (routine) without abnormal findings: Secondary | ICD-10-CM | POA: Insufficient documentation

## 2014-02-22 LAB — COMPREHENSIVE METABOLIC PANEL
ALT: 9 U/L (ref 0–35)
AST: 13 U/L (ref 0–37)
Albumin: 4 g/dL (ref 3.5–5.2)
Alkaline Phosphatase: 41 U/L (ref 39–117)
BUN: 21 mg/dL (ref 6–23)
CO2: 27 meq/L (ref 19–32)
CREATININE: 0.9 mg/dL (ref 0.50–1.10)
Calcium: 8.8 mg/dL (ref 8.4–10.5)
Chloride: 102 mEq/L (ref 96–112)
GLUCOSE: 88 mg/dL (ref 70–99)
Potassium: 4.3 mEq/L (ref 3.5–5.3)
SODIUM: 136 meq/L (ref 135–145)
TOTAL PROTEIN: 6.6 g/dL (ref 6.0–8.3)
Total Bilirubin: 0.5 mg/dL (ref 0.2–1.2)

## 2014-02-22 LAB — LIPID PANEL
CHOLESTEROL: 178 mg/dL (ref 0–200)
HDL: 58 mg/dL (ref >39)
LDL Cholesterol: 99 mg/dL (ref 0–99)
TRIGLYCERIDES: 106 mg/dL (ref <150)
Total CHOL/HDL Ratio: 3.1 Ratio
VLDL: 21 mg/dL (ref 0–40)

## 2014-02-22 LAB — TSH: TSH: 2.331 u[IU]/mL (ref 0.350–4.500)

## 2014-02-22 MED ORDER — SERTRALINE HCL 100 MG PO TABS: ORAL_TABLET | ORAL | Status: DC

## 2014-02-22 MED ORDER — ALPRAZOLAM 0.25 MG PO TABS: 0.2500 mg | ORAL_TABLET | Freq: Three times a day (TID) | ORAL | Status: DC | PRN

## 2014-02-22 MED ORDER — LEVOTHYROXINE SODIUM 112 MCG PO TABS: 112.0000 ug | ORAL_TABLET | Freq: Every day | ORAL | Status: DC

## 2014-02-22 NOTE — Progress Notes (Signed)
Chief Complaint  Patient presents with  . Annual Exam    nonfasting annual exam (blood in lab) with pap. Does mention off and on leg pain. Also recently found out that she snores, does complain of fatigue.    Olivia Cunningham is a 63 y.o. female who presents for a complete physical.  She has the following concerns:  Hypothyroidism: Compliant with medications, taking on empty stomach, not with food or other medications/vitamins. Denies any symptoms--just mild hair loss that she relates to menopause. No skin/bowel changes.   She admits that she hadn't been as good with her meds prior to being diagnosed/treated for trigger finger.  Hyperlipidemia follow-up: Patient is reportedly following a low-fat, low cholesterol diet. Compliant with medications and denies medication side effects. Mild, infrequent muscle aches in her leg, inconsistent, tolerable.  Depression/Anxiety: She increased the dose of zoloft in November or December 2013 when she was more irritable, short-tempered, and it had been working well.  She can get "overly-intense" when very stressed at work, noticeable by staff and recently brought to her attention.  She can do some breathing exercises which help.  Her brother gave her some clonazepam.  She took a full pill and felt "stoned".  She took it at 1/2 tablet for a week after getting the feedback from staff, but hasn't really needed since (took it prior to having to fire someone at work).  A friend recently told her that she snores loudly.  Sometimes she wakes up feeling unrefreshed, despite getting enough sleep. This is only occasionally.  Denies daytime somnolence . Doesn't wake up with headaches. Denies congestion currently.  She has Breatheright strips.   Immunization History  Administered Date(s) Administered  . DTaP 12/09/2009  . Hepatitis A 11/06/2010  (got first HepA at [facility] de[facility], 10/2010 given here) Hasn't been getting flu shots Last Pap smear: 05/2008 Last mammogram:  09/2013 Last colonoscopy: 07/2011 Dr. Collene Mares, due again 2017 Last DEXA: approx 2006-7 Dentist: twice yearly Ophtho: yearly, overdue Exercise: "I've been bad"--walking 3x/week x 45-50 minutes.  Hasn't been getting weight-bearing exercise.  Past Medical History  Diagnosis Date  . Cholesterol serum elevated   . Depression   . Anxiety   . Thyroid disease     HYPOTHYROID, NODULE  . Achalasia   . Colon polyp     Dr. Collene Mares; last colonoscopy 07/2011 normal (due again 2017)    Past Surgical History  Procedure Laterality Date  . Breast surgery      LEFT LUMPECTOMY (benign)  . Arthroscopic knee (left) Left   . Esophagogastroduodenoscopy (egd) with esophageal dilation  early 1980's    x3 at [facility] (treating achalasia with stretching)    History   Social History  . Marital Status: Single    Spouse Name: N/A    Number of Children: N/A  . Years of Education: N/A   Occupational History  . 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 Smoking status: Never Smoker   . Smokeless tobacco: Never Used  . Alcohol Use: Yes     Comment: 0-1 glass of wine per week.  . Drug Use: No  . Sexual Activity: Not Currently   Other Topics Concern  . Not on file   Social History Narrative   Lives alone    Family History  Problem Relation Age of Onset  . Hypertension Mother   . Heart disease Mother     CHF; CABG and valve replacement  . Arthritis  Father   . Heart disease Father     MI at 43, CABG at 49  . Cancer Sister     BREAST  . Breast cancer Sister     late 40's/early 60's (premenopausal)  . Heart disease Brother   . Bipolar disorder Brother   . Hyperlipidemia Brother   . Heart disease Maternal Grandmother   . Heart disease Maternal Grandfather   . Heart disease Paternal Grandmother   . Heart disease Paternal Grandfather   . Colon cancer Neg Hx   . Diabetes Neg Hx   . Stroke Maternal Aunt 78  . Breast cancer Paternal Aunt 12   Outpatient  Encounter Prescriptions as of 02/22/2014  Medication Sig Note  . aspirin 81 MG tablet Take 81 mg by mouth daily.   . Calcium-Magnesium-Vitamin D (CALCIUM 500 PO) Take 1 tablet by mouth daily. 02/22/2014: Ran out 3 weeks ago  . Coenzyme Q10 (CO Q-10) 300 MG CAPS Take by mouth.   . levothyroxine (SYNTHROID, LEVOTHROID) 112 MCG tablet Take 1 tablet (112 mcg total) by mouth daily.   . Multiple Vitamin (MULTIVITAMIN) tablet Take 1 tablet by mouth daily.     . sertraline (ZOLOFT) 100 MG tablet TAKE 1 TABLET (100 MG TOTAL) BY MOUTH DAILY.   . simvastatin (ZOCOR) 80 MG tablet TAKE 1/2 TABLET (40 MG TOTAL) BY MOUTH AT BEDTIME.   . [DISCONTINUED] levothyroxine (SYNTHROID, LEVOTHROID) 112 MCG tablet Take 1 tablet (112 mcg total) by mouth daily.   . [DISCONTINUED] sertraline (ZOLOFT) 100 MG tablet TAKE 1 TABLET (100 MG TOTAL) BY MOUTH DAILY.   Marland Kitchen ALPRAZolam (XANAX) 0.25 MG tablet Take 1-2 tablets (0.25-0.5 mg total) by mouth 3 (three) times daily as needed for anxiety.   . [DISCONTINUED] sertraline (ZOLOFT) 100 MG tablet Take 1 tablet (100 mg total) by mouth daily.   . [DISCONTINUED] simvastatin (ZOCOR) 80 MG tablet Take 0.5 tablets (40 mg total) by mouth at bedtime.    (alprazolam rx'd today)  Allergies  Allergen Reactions  . Demerol Nausea And Vomiting  . Penicillins Rash   ROS:  The patient denies anorexia, fever, weight changes, headaches,  vision changes, decreased hearing, ear pain, sore throat, breast concerns, chest pain, palpitations, dizziness, syncope, dyspnea on exertion, cough, swelling, nausea, vomiting, diarrhea, constipation, abdominal pain, melena, hematochezia, indigestion/heartburn, hematuria, incontinence, dysuria, vaginal bleeding, discharge, odor or itch, genital lesions, joint pains, numbness, tingling, weakness, tremor, suspicious skin lesions, depression, anxiety, abnormal bleeding/bruising, or enlarged lymph nodes. Weight is the same as 2 years ago, up some from last year Trigger  finger L thumb, treated with injection (Dr. Daylene Katayama), resolved Intermittent plugging/decreased hearing Left ear See HPI  PHYSICAL EXAM: BP 100/64  Pulse 84  Ht 5' 4.5" (1.638 m)  Wt 150 lb (68.04 kg)  BMI 25.36 kg/m2  General Appearance:    Alert, cooperative, no distress, appears stated age  Head:    Normocephalic, without obvious abnormality, atraumatic  Eyes:    PERRL, conjunctiva/corneas clear, EOM's intact, fundi    benign  Ears:    Normal TM's and external ear canals. Large piece of hard cerumen in left ear, partly occluding TM  Nose:   Nares normal, mucosa normal, no drainage or sinus   tenderness  Throat:   Lips, mucosa, and tongue normal; teeth and gums normal  Neck:   Supple, no lymphadenopathy;  thyroid:  Mild diffuse enlargement; no tenderness/nodules; no carotid   bruit or JVD  Back:    Spine nontender, no curvature, ROM normal,  no CVA     tenderness  Lungs:     Clear to auscultation bilaterally without wheezes, rales or     ronchi; respirations unlabored  Chest Wall:    No tenderness or deformity   Heart:    Regular rate and rhythm, S1 and S2 normal, no murmur, rub   or gallop  Breast Exam:    No tenderness, masses, or nipple discharge or inversion.  She does have fibroglandular changes noted bilaterally, mildly tender in UOQ's.  No axillary lymphadenopathy  Abdomen:     Soft, non-tender, nondistended, normoactive bowel sounds,    no masses, no hepatosplenomegaly  Genitalia:    Normal external genitalia without lesions.  BUS and vagina normal; cervix without lesions, or cervical motion tenderness. No abnormal vaginal discharge.  Uterus and adnexa not enlarged, nontender, no masses.  Pap performed. There were some atrophic changes.  Stenotic cervical os.  Rectal:    Normal tone, no masses or tenderness; guaiac negative stool  Extremities:   No clubbing, cyanosis or edema  Pulses:   2+ and symmetric all extremities  Skin:   Skin color, texture, turgor normal, no rashes or  lesions  Lymph nodes:   Cervical, supraclavicular, and axillary nodes normal  Neurologic:   CNII-XII intact, normal strength, sensation and gait; reflexes 2+ and symmetric throughout          Psych:   Normal mood, affect, hygiene and grooming.    ASSESSMENT/PLAN:  Unspecified hypothyroidism - euthyroid by history, better with compliance (since trigger finger) - Plan: TSH, levothyroxine (SYNTHROID, LEVOTHROID) 112 MCG tablet  Anxiety state, unspecified - suboptimally controlled due to work stress. xanax prn; stress reduction techniques, consider counseling. do not take bro's meds - Plan: ALPRAZolam (XANAX) 0.25 MG tablet, sertraline (ZOLOFT) 100 MG tablet  Pure hypercholesterolemia - Plan: Comprehensive metabolic panel, Lipid panel  Routine general medical examination at a health care facility - Plan: Comprehensive metabolic panel, Lipid panel, TSH, Cytology - PAP [place]  Encounter for long-term (current) use of other medications - Plan: Comprehensive metabolic panel, Lipid panel, TSH  Snoring - discussed sleep apnea, causes of snoring. Has friend visiting soon and will ask re: apnea. Borderline symptoms.  consider sleep study  Discussed monthly self breast exams and yearly mammograms; at least 30 minutes of aerobic activity at least 5 days/week, weight bearing exercise at least 2x/wk; proper sunscreen use reviewed; healthy diet, including goals of calcium and vitamin D intake and alcohol recommendations (less than or equal to 1 drink/day) reviewed; regular seatbelt use; changing batteries in smoke detectors.  Immunization recommendations discussed--yearly flu shots are recommended, and zostavax.  Colonoscopy recommendations reviewed, UTD.  Risks/side effects/benefits of zostavax reviewed.  Check with insurance and schedule NV if desired.  Has a friend coming to visit soon--will have her listen re: snoring and apnea and will contact us to set up sleep study if needed.  Discussed sleep  studies in detail, and reasons for diagnosing and treating sleep apnea.  Anxiety--reviewed risks/side effects of xanax, to use just prn.  Consider counseling if ongoing issues with anxiety. If needing xanax frequently, to f/u to discuss med changes. Counseled extensively.  Counseled for >20-25 minutes: re: anxiety, snoring/sleep apnea, thyroid,in addition to her health maintenance counseling.  Total visit time was over an hour.

## 2014-02-22 NOTE — Patient Instructions (Addendum)
  HEALTH MAINTENANCE RECOMMENDATIONS:  It is recommended that you get at least 30 minutes of aerobic exercise at least 5 days/week (for weight loss, you may need as much as 60-90 minutes). This can be any activity that gets your heart rate up. This can be divided in 10-15 minute intervals if needed, but try and build up your endurance at least once a week.  Weight bearing exercise is also recommended twice weekly.  Eat a healthy diet with lots of vegetables, fruits and fiber.  "Colorful" foods have a lot of vitamins (ie green vegetables, tomatoes, red peppers, etc).  Limit sweet tea, regular sodas and alcoholic beverages, all of which has a lot of calories and sugar.  Up to 1 alcoholic drink daily may be beneficial for women (unless trying to lose weight, watch sugars).  Drink a lot of water.  Calcium recommendations are 1200-1500 mg daily (1500 mg for postmenopausal women or women without ovaries), and vitamin D 1000 IU daily.  This should be obtained from diet and/or supplements (vitamins), and calcium should not be taken all at once, but in divided doses.  Monthly self breast exams and yearly mammograms for women over the age of 91 is recommended.  Sunscreen of at least SPF 30 should be used on all sun-exposed parts of the skin when outside between the hours of 10 am and 4 pm (not just when at beach or pool, but even with exercise, golf, tennis, and yard work!)  Use a sunscreen that says "broad spectrum" so it covers both UVA and UVB rays, and make sure to reapply every 1-2 hours.  Remember to change the batteries in your smoke detectors when changing your clock times in the spring and fall.  Use your seat belt every time you are in a car, and please drive safely and not be distracted with cell phones and texting while driving.   Check with your insurance re: coverage of zostavax (shingles vaccine).  Schedule a nurse visit if desired (remember it needs to be 1 month apart from any other  vaccine).  Yearly flu shots are recommended.  Use the alprazolam sparingly, only as needed for significant anxiety.  Use caution with driving after taking. Consider counseling if ongoing trouble with handling stress.

## 2014-02-24 LAB — CYTOLOGY - PAP

## 2014-06-18 ENCOUNTER — Other Ambulatory Visit: Payer: Self-pay | Admitting: Family Medicine

## 2014-08-25 ENCOUNTER — Telehealth: Payer: Self-pay | Admitting: Family Medicine

## 2014-08-25 NOTE — Telephone Encounter (Signed)
Hasn't been in good shape (mood and nerves) over the last month. This is related to her job.  She isn't allowed to handle things the way she wants to (related to a significant threat to [employer] day school).  He hasn't been able to make her laugh, like he always has.  He is trying to get her to take xanax daily.  He prefers for me not to mention that he called.

## 2014-08-25 NOTE — Telephone Encounter (Signed)
LMOVM to call back (given by cell#), to touch base.  Chart reviewed, had discussed anxiety at last visit.

## 2014-08-25 NOTE — Telephone Encounter (Signed)
[name]'s brother, [name], called with concerns with his sister. He is on her HIPPA. He states he is very concerned about her and would like to speak to you. He states she is extremely stressed. Please call brother, [name], at [phone].

## 2014-08-25 NOTE — Telephone Encounter (Signed)
Pt returned call at 8:40.  She was most appreciative that I called expressing concern (related to stress/anxiety over threatening letter received at [employer] day school).  She states she had a rough 2 weeks, but had a good meeting today (with all involved parties--FBI, police, other [employer]).  She admits to using the xanax when needed, admitted that her brother was able to sense her stress.  We discussed the possibility of increase zoloft to 150mg  temporarily (she admits this time of the year is often a little harder for her) vs ongoing prn use of xanax if stressors are expected to be short-term.  She feels she is already doing a little better. Will continue with prn xanax and call when refill  Is needed.  (she was most appreciative of my call, and was not aware that her brother was the one who initiated concern/call)

## 2014-12-25 ENCOUNTER — Other Ambulatory Visit: Payer: Self-pay | Admitting: Family Medicine

## 2014-12-27 NOTE — Telephone Encounter (Signed)
Looks like she is due in June for CPE-next avail is in Sept. Would you like me to call her and schedule something and have her come in for labs in June? Or something else?

## 2014-12-27 NOTE — Telephone Encounter (Signed)
She would need med check with fasting labs in June, and a separate wellness, but if she is put on the cancellation list and we can do together, then we can do them together.  I don't want her labs delayed, since she only has them checked once yearly (thyroid, lipids, etc)

## 2015-03-09 ENCOUNTER — Ambulatory Visit (INDEPENDENT_AMBULATORY_CARE_PROVIDER_SITE_OTHER): Payer: 59 | Admitting: Family Medicine

## 2015-03-09 ENCOUNTER — Encounter: Payer: Self-pay | Admitting: Family Medicine

## 2015-03-09 VITALS — BP 104/60 | HR 68 | Ht 64.5 in | Wt 151.0 lb

## 2015-03-09 DIAGNOSIS — F411 Generalized anxiety disorder: Secondary | ICD-10-CM | POA: Diagnosis not present

## 2015-03-09 DIAGNOSIS — E7800 Pure hypercholesterolemia, unspecified: Secondary | ICD-10-CM

## 2015-03-09 DIAGNOSIS — E039 Hypothyroidism, unspecified: Secondary | ICD-10-CM

## 2015-03-09 LAB — COMPREHENSIVE METABOLIC PANEL
ALBUMIN: 3.8 g/dL (ref 3.5–5.2)
ALK PHOS: 45 U/L (ref 39–117)
ALT: 11 U/L (ref 0–35)
AST: 16 U/L (ref 0–37)
BUN: 18 mg/dL (ref 6–23)
CHLORIDE: 103 meq/L (ref 96–112)
CO2: 27 mEq/L (ref 19–32)
Calcium: 9 mg/dL (ref 8.4–10.5)
Creat: 0.84 mg/dL (ref 0.50–1.10)
Glucose, Bld: 93 mg/dL (ref 70–99)
POTASSIUM: 4.5 meq/L (ref 3.5–5.3)
SODIUM: 140 meq/L (ref 135–145)
TOTAL PROTEIN: 6.7 g/dL (ref 6.0–8.3)
Total Bilirubin: 0.5 mg/dL (ref 0.2–1.2)

## 2015-03-09 LAB — TSH: TSH: 2.652 u[IU]/mL (ref 0.350–4.500)

## 2015-03-09 LAB — LIPID PANEL
CHOLESTEROL: 189 mg/dL (ref 0–200)
HDL: 51 mg/dL (ref >=46)
LDL CALC: 108 mg/dL — AB (ref 0–99)
Total CHOL/HDL Ratio: 3.7 Ratio
Triglycerides: 152 mg/dL — ABNORMAL HIGH (ref <150)
VLDL: 30 mg/dL (ref 0–40)

## 2015-03-09 NOTE — Patient Instructions (Signed)
Continue your current medications. Be careful with the grapefruit juice and timing of your medications. Try and make sure you are getting 1200mg  of calcium daily through your diet, and if not, you might want to add back a supplement  We will refill your medications after your labs are back (within the next 2 days).

## 2015-03-09 NOTE — Progress Notes (Signed)
Chief Complaint  Patient presents with  . Hypothyroidism    fasting(blood in lab from this am) med check.   Hypothyroidism: Compliant with medications, taking on empty stomach, not with food or other medications/vitamins. Denies any symptoms--just mild hair loss that she relates to menopause--her hair dresser doesn't notice any change. No skin/bowel changes.  Hyperlipidemia follow-up: Patient is reportedly following a low-fat, low cholesterol diet. Compliant with medications and denies medication side effects. Mild, infrequent muscle aches in her leg, inconsistent, tolerable. Usually at night.  Some more ice cream during the summer time.  Depression/Anxiety: She continues on the 100mg  of zoloft, and feels like her moods are well controlled.  Denies any significant irritability, less frequent than in the past.  She is able to "let things go" that used to bother her more.  Uses alprazolam only about once a month when very stressed.  Occasional heartburn.  Some grapefruit juice occasionally, separated from medications.  PMH, PSH, SH reviewed  Outpatient Encounter Prescriptions as of 03/09/2015  Medication Sig Note  . ALPRAZolam (XANAX) 0.25 MG tablet Take 1-2 tablets (0.25-0.5 mg total) by mouth 3 (three) times daily as needed for anxiety. 03/09/2015: Uses very sporadically, about once a month  . aspirin 81 MG tablet Take 81 mg by mouth daily.   . Coenzyme Q10 (CO Q-10) 300 MG CAPS Take by mouth.   . levothyroxine (SYNTHROID, LEVOTHROID) 112 MCG tablet Take 1 tablet (112 mcg total) by mouth daily.   . Multiple Vitamin (MULTIVITAMIN) tablet Take 1 tablet by mouth daily.     . sertraline (ZOLOFT) 100 MG tablet TAKE 1 TABLET (100 MG TOTAL) BY MOUTH DAILY.   . simvastatin (ZOCOR) 80 MG tablet TAKE 1/2 TABLET (40 MG TOTAL) BY MOUTH AT BEDTIME.   . [DISCONTINUED] Calcium-Magnesium-Vitamin D (CALCIUM 500 PO) Take 1 tablet by mouth daily. 02/22/2014: Ran out 3 weeks ago  . [DISCONTINUED] simvastatin  (ZOCOR) 80 MG tablet TAKE 0.5 TABLETS (40 MG TOTAL) BY MOUTH AT BEDTIME.    No facility-administered encounter medications on file as of 03/09/2015.   She is no longer taking the calcium supplement she was taking.  Still taking multivitamin.  Allergies  Allergen Reactions  . Demerol Nausea And Vomiting  . Penicillins Rash   ROS:  No fever, chills, URI/allergy symptoms. No headaches, dizziness, chest pain, palpitations, shortness of breath, nausea, vomiting, bowel changes, urinary complaints, joint pains, depression, bleeding, bruising rash or other complaints.  PHYSICAL EXAM: BP 104/60 mmHg  Pulse 68  Ht 5' 4.5" (1.638 m)  Wt 151 lb (68.493 kg)  BMI 25.53 kg/m2  Well developed, pleasant female in no distress HEENT: PERRL, EOMI, conjunctiva clear.  OP clear Neck: no lymphadenopathy thyromegaly, carotid bruit Heart: regular rate and rhythm without murmur Lungs: clear bilaterally Abdomen: soft, nontender, no organomegaly or mass Back: no CVA or spinal tenderness Extremities: no clubbing, cyanosis or edema, 2+ pulse Neuro: alert and oriented.  Cranial nerves intact. Normal strength, gait Psych: normal mood, affect, hygiene, grooming Skin: no rashes, bruising. Normal turgor, texture  ASSESSMENT/PLAN:  Hypothyroidism, unspecified hypothyroidism type - euthyroid by history.  labs due - Plan: TSH  Anxiety state - well controlled  Pure hypercholesterolemia - Plan: Lipid panel, Comprehensive metabolic panel   REFILL ALL MED X 1 YEAR WHEN LABS BACK

## 2015-03-10 MED ORDER — SERTRALINE HCL 100 MG PO TABS: ORAL_TABLET | ORAL | Status: DC

## 2015-03-10 MED ORDER — SIMVASTATIN 80 MG PO TABS: ORAL_TABLET | ORAL | Status: DC

## 2015-03-10 MED ORDER — LEVOTHYROXINE SODIUM 112 MCG PO TABS: 112.0000 ug | ORAL_TABLET | Freq: Every day | ORAL | Status: DC

## 2015-05-12 ENCOUNTER — Encounter: Payer: Self-pay | Admitting: Family Medicine

## 2015-05-12 ENCOUNTER — Ambulatory Visit (INDEPENDENT_AMBULATORY_CARE_PROVIDER_SITE_OTHER): Payer: 59 | Admitting: Family Medicine

## 2015-05-12 VITALS — BP 102/60 | HR 72 | Ht 64.0 in | Wt 150.0 lb

## 2015-05-12 DIAGNOSIS — Z0000 Encounter for general adult medical examination without abnormal findings: Secondary | ICD-10-CM

## 2015-05-12 DIAGNOSIS — R350 Frequency of micturition: Secondary | ICD-10-CM

## 2015-05-12 LAB — HEMOCCULT GUIAC POC 1CARD (OFFICE): FECAL OCCULT BLD: NEGATIVE

## 2015-05-12 LAB — POCT URINALYSIS DIPSTICK
Bilirubin, UA: NEGATIVE
Glucose, UA: NEGATIVE
KETONES UA: NEGATIVE
Nitrite, UA: NEGATIVE
PH UA: 5.5
PROTEIN UA: NEGATIVE
Spec Grav, UA: >=1.03
UROBILINOGEN UA: NEGATIVE

## 2015-05-12 NOTE — Patient Instructions (Signed)

## 2015-05-12 NOTE — Progress Notes (Signed)
Chief Complaint  Patient presents with  . Annual Exam    nonfasting annual exam with pelvic. Increased urinary frequency. No other concerns. Declined flu shot.    Olivia Cunningham is a 64 y.o. female who presents for a complete physical.  She has the following concerns:  She is reporting urinary frequency for the last 4-5 weeks.  She is now getting up 2x/night rather than just once, and going more frequently during the day. She has some urgency, and sometimes there isn't much volume.  Denies dysuria, denies odor or blood to the urine. Denies fever, chills, flank or abdominal pain. No change in caffeine intake.  She had med check the end of June, with labs and medications refilled.  She has no other complaints today.  Immunization History  Administered Date(s) Administered  . Hepatitis A 11/06/2010  . Td 01/08/2006  . Tdap 12/09/2009   (got [facility] HepA [facility], 10/2010 given here) Hasn't been getting flu shots Last Pap smear: 02/2014, normal, no high risk HPV Last mammogram: 09/2013 Last colonoscopy: 07/2011 Dr. Collene Mares, due again 2017 Last DEXA: approx 2006-7 Dentist: twice yearly Ophtho: yearly, due now Exercise: walking very infrequently in the last few weeks, had been going 2x/week (down from when she used to walk 3-4 days/week).  Moves boxes at work, no other weight bearing exercise  Past Medical History  Diagnosis Date  . Cholesterol serum elevated   . Depression   . Anxiety   . Thyroid disease     HYPOTHYROID, NODULE  . Achalasia   . Colon polyp     Dr. Collene Mares; last colonoscopy 07/2011 normal (due again 2017)    Past Surgical History  Procedure Laterality Date  . Breast surgery      LEFT LUMPECTOMY (benign)  . Arthroscopic knee (left) Left   . Esophagogastroduodenoscopy (egd) with esophageal dilation  early 1980's    x3 at [facility] (treating achalasia with stretching)    Social History   Social History  . Marital Status: Single    Spouse Name: N/A  .  Number of Children: N/A  . Years of Education: N/A   Occupational History  . 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 Smoking status: Never Smoker   . Smokeless tobacco: Never Used  . Alcohol Use: Yes     Comment: 0-1 glass of wine per week.  . Drug Use: No  . Sexual Activity: Not Currently   Other Topics Concern  . Not on file   Social History Narrative   Lives alone    Family History  Problem Relation Age of Onset  . Hypertension Mother   . Heart disease Mother     CHF; CABG and valve replacement  . Cancer Mother     squamous cell cancer of tongue  . Arthritis Father   . Heart disease Father     MI at 85, CABG at 21  . Cancer Sister     BREAST  . Breast cancer Sister     late 40's/early 50's (premenopausal)  . Heart disease Brother   . Bipolar disorder Brother   . Hyperlipidemia Brother   . Heart disease Maternal Grandmother   . Heart disease Maternal Grandfather   . Heart disease Paternal Grandmother   . Heart disease Paternal Grandfather   . Colon cancer Neg Hx   . Diabetes Neg Hx   . Stroke Maternal Aunt 78  . Breast cancer Paternal Aunt 2  Outpatient Encounter Prescriptions as of 05/12/2015  Medication Sig Note  . ALPRAZolam (XANAX) 0.25 MG tablet Take 1-2 tablets (0.25-0.5 mg total) by mouth 3 (three) times daily as needed for anxiety. 03/09/2015: Uses very sporadically, about once a month  . aspirin 81 MG tablet Take 81 mg by mouth daily.   . Coenzyme Q10 (CO Q-10) 300 MG CAPS Take by mouth.   . levothyroxine (SYNTHROID, LEVOTHROID) 112 MCG tablet Take 1 tablet (112 mcg total) by mouth daily.   . Multiple Vitamin (MULTIVITAMIN) tablet Take 1 tablet by mouth daily.     . sertraline (ZOLOFT) 100 MG tablet TAKE 1 TABLET (100 MG TOTAL) BY MOUTH DAILY.   . simvastatin (ZOCOR) 80 MG tablet TAKE 1/2 TABLET (40 MG TOTAL) BY MOUTH AT BEDTIME.    No facility-administered encounter medications on file as of 05/12/2015.     Allergies  Allergen Reactions  . Demerol Nausea And Vomiting  . Penicillins Rash    ROS: The patient denies anorexia, fever, weight changes, headaches, vision changes, decreased hearing, ear pain, sore throat, breast concerns, chest pain, palpitations, dizziness, syncope, dyspnea on exertion, cough, swelling, nausea, vomiting, diarrhea, constipation, abdominal pain, melena, hematochezia, indigestion/heartburn, hematuria, incontinence, dysuria, vaginal bleeding, discharge, odor or itch, genital lesions, joint pains, numbness, tingling, weakness, tremor, suspicious skin lesions, depression, anxiety, abnormal bleeding/bruising, or enlarged lymph nodes. Intermittent headaches, and allergy symptoms in the mornings recently. Occasional heartburn with certain foods (garlic, spicy foods). Some easy bruising on her legs, unchanged.  Intermittent leg pains at night--mostly subsided. Some intermittent insomnia (takes a little while to fall asleep).   Urinary frequency as per HPI  PHYSICAL EXAM: BP 102/60 mmHg  Pulse 72  Ht 5\' 4"  (1.626 m)  Wt 150 lb (68.04 kg)  BMI 25.73 kg/m2  General Appearance:   Alert, cooperative, no distress, appears stated age  Head:   Normocephalic, without obvious abnormality, atraumatic  Eyes:   PERRL, conjunctiva/corneas clear, EOM's intact, fundi   benign  Ears:   Normal TM's and external ear canals.  Nose:  Nares normal, mucosa mildly edematous, no drainage or sinus tenderness  Throat:  Lips, mucosa, and tongue normal; teeth and gums normal  Neck:  Supple, no lymphadenopathy; thyroid: Mild diffuse enlargement; no tenderness/nodules; no carotid  bruit or JVD  Back:  Spine nontender, no curvature, ROM normal, no CVA tenderness  Lungs:   Clear to auscultation bilaterally without wheezes, rales or ronchi; respirations unlabored  Chest Wall:   No tenderness or deformity  Heart:   Regular rate and rhythm, S1 and S2  normal, no murmur, rub  or gallop  Breast Exam:   No tenderness, masses, or nipple discharge or inversion. She does have fibroglandular changes noted bilaterally, mildly tender in UOQ's. There is a more prominent fibroglandular area in the right UOQ (10 o'clock).  On the left, it is more diffuse, and more lateral. Mildly tender. No axillary lymphadenopathy  Abdomen:   Soft, non-tender, nondistended, normoactive bowel sounds,   no masses, no hepatosplenomegaly  Genitalia:   Normal external genitalia without lesions. BUS and vagina normal; no cervical motion tenderness. No abnormal vaginal discharge. Uterus and adnexa not enlarged, nontender, no masses. Pap not perform  Rectal:   Normal tone, no masses or tenderness; guaiac negative stool  Extremities:  No clubbing, cyanosis or edema  Pulses:  2+ and symmetric all extremities  Skin:  Skin color, texture, turgor normal, no rashes or lesions  Lymph nodes:  Cervical, supraclavicular, and axillary nodes normal  Neurologic:  CNII-XII intact, normal strength, sensation and gait; reflexes 2+ and symmetric throughout  Psych: Normal mood, affect, hygiene and grooming         Lab Results  Component Value Date   CHOL 189 03/09/2015   HDL 51 03/09/2015   LDLCALC 108* 03/09/2015   TRIG 152* 03/09/2015   CHOLHDL 3.7 03/09/2015     Chemistry      Component Value Date/Time   NA 140 03/09/2015 0001   K 4.5 03/09/2015 0001   CL 103 03/09/2015 0001   CO2 27 03/09/2015 0001   BUN 18 03/09/2015 0001   CREATININE 0.84 03/09/2015 0001      Component Value Date/Time   CALCIUM 9.0 03/09/2015 0001   ALKPHOS 45 03/09/2015 0001   AST 16 03/09/2015 0001   ALT 11 03/09/2015 0001   BILITOT 0.5 03/09/2015 0001     Fasting glucose 93  Lab Results  Component Value Date   TSH 2.652 03/09/2015    Urine dip from today: SG >= 1.030, 2+ blood, 1+ leuks  ASSESSMENT/PLAN:  Annual physical  exam - Plan: Visual acuity screening, POCT Urinalysis Dipstick, Hemoccult - 1 Card (office)  Urinary frequency - Plan: POCT Urinalysis Dipstick, Urine culture   Discussed monthly self breast exams and yearly mammograms (past due and reminded to schedule); at least 30 minutes of aerobic activity at least 5 days/week, weight bearing exercise at least 2x/wk; proper sunscreen use reviewed; healthy diet, including goals of calcium and vitamin D intake and alcohol recommendations (less than or equal to 1 drink/day) reviewed; regular seatbelt use; changing batteries in smoke detectors. Immunization recommendations discussed--yearly flu shots are recommended (will return next week, declines today), and zostavax (risks/benefits reviewed.  Will check insurance, and return in a month/after trip to [place], if covered). Colonoscopy recommendations reviewed, UTD.  Flu shot next week. Shingles, if covered, next month (after trip). Understands should be separated from other vaccine (flu shot) by a month.  Urine culture sent. Will treat if +, given minimal symptoms and length of symptoms (rather than presumptively)

## 2015-05-13 ENCOUNTER — Encounter: Payer: Self-pay | Admitting: Family Medicine

## 2015-05-13 NOTE — Progress Notes (Signed)
I spoke to Olivia Cunningham and asked them to correct diagnosis and resubmit claim to insurance

## 2015-05-15 LAB — URINE CULTURE: Colony Count: >=100000

## 2015-05-18 ENCOUNTER — Other Ambulatory Visit: Payer: Self-pay | Admitting: *Deleted

## 2015-05-18 MED ORDER — NITROFURANTOIN MONOHYD MACRO 100 MG PO CAPS: 100.0000 mg | ORAL_CAPSULE | Freq: Two times a day (BID) | ORAL | Status: DC

## 2015-05-19 ENCOUNTER — Encounter: Payer: Self-pay | Admitting: Family Medicine

## 2015-05-23 ENCOUNTER — Encounter: Payer: Self-pay | Admitting: Family Medicine

## 2015-05-25 ENCOUNTER — Other Ambulatory Visit: Payer: Self-pay

## 2015-07-20 ENCOUNTER — Other Ambulatory Visit: Payer: Self-pay | Admitting: Family Medicine

## 2015-07-20 NOTE — Telephone Encounter (Signed)
Yes, for #20.  I'll send her a message to touch base and make sure everything is okay!

## 2015-07-20 NOTE — Telephone Encounter (Signed)
Is this okay to refill? 

## 2015-07-27 ENCOUNTER — Other Ambulatory Visit: Payer: Self-pay

## 2015-07-27 DIAGNOSIS — Z1231 Encounter for screening mammogram for malignant neoplasm of breast: Secondary | ICD-10-CM

## 2015-08-31 ENCOUNTER — Ambulatory Visit
Admission: RE | Admit: 2015-08-31 | Discharge: 2015-08-31 | Disposition: A | Discharge status: Home / Self Care | Payer: 59 | Source: Ambulatory Visit

## 2015-08-31 DIAGNOSIS — Z1231 Encounter for screening mammogram for malignant neoplasm of breast: Secondary | ICD-10-CM

## 2015-09-21 ENCOUNTER — Other Ambulatory Visit (INDEPENDENT_AMBULATORY_CARE_PROVIDER_SITE_OTHER): Payer: 59

## 2015-09-21 DIAGNOSIS — Z23 Encounter for immunization: Secondary | ICD-10-CM | POA: Diagnosis not present

## 2015-11-15 ENCOUNTER — Encounter: Payer: Self-pay | Admitting: Family Medicine

## 2015-11-17 ENCOUNTER — Ambulatory Visit (INDEPENDENT_AMBULATORY_CARE_PROVIDER_SITE_OTHER): Payer: 59 | Admitting: Family Medicine

## 2015-11-17 ENCOUNTER — Encounter: Payer: Self-pay | Admitting: Family Medicine

## 2015-11-17 VITALS — BP 100/60 | HR 68 | Ht 64.0 in | Wt 152.0 lb

## 2015-11-17 DIAGNOSIS — M79604 Pain in right leg: Secondary | ICD-10-CM

## 2015-11-17 MED ORDER — NAPROXEN 500 MG PO TABS: 500.0000 mg | ORAL_TABLET | Freq: Two times a day (BID) | ORAL | Status: DC

## 2015-11-17 NOTE — Patient Instructions (Signed)
Take the naproxen twice daily with food. Discontinue or cut back the dose if you develop stomach pain/discomfort/side effects.  Do not take other over-the-counter pain medications such as ibuprofen, advil, motrin, aleve, naproxen, BC, Goody or Exedrin/aspirin at the same time.  Do not use longer than recommended.  It is okay to use acetaminophen (tylenol) along with this medication.  Trial of ice vs heat vs alternating. Consider trial of topical medications, if needed (biofreeze, icy hot). Do stretches of the anterior tibialis muscles as shown.  Shin Splints Shin splints is a painful condition that is felt on the bone that is located in the front of the lower leg (tibia or shin bone) or in the muscles on either side of the bone. This condition happens when physical activities lead to inflammation of the muscles, tendons, and the thin layer that covers the shin bone. It may result from participating in sports or other demanding exercise. CAUSES This condition may be caused by:  Overuse of muscles.  Repetitive activities.  Flat feet or rigid arches. Activities that could contribute to shin splints include:  Having a sudden increase in exercise time.  Starting a new, demanding activity.  Running up hills or long distances.  Playing sports that involve sudden starts and stops.  Using a poor warm-up.  Wearing old or worn-out shoes. SYMPTOMS Symptoms of this condition include:  Pain on the front of the lower leg.  Pain while exercising or at rest. DIAGNOSIS This condition may be diagnosed based on a physical exam and the history of your symptoms. Your health care provider may observe you as you walk or run. You may also have X-rays or other imaging tests to rule out other problems that could cause lower leg pain, such as a stress fracture. TREATMENT Treatment for this condition may depend on your age, history, health, and how bad the pain is. Most cases of shin splints can be managed  by doing one or more of the following:  Resting.  Reducing the length and intensity of your exercise.  Stopping the activity that causes shin pain.  Taking medicines to control the inflammation.  Icing, massaging, stretching, and strengthening the affected area.  Wearing shoes that have rigid heels, shock absorption, and a good arch support. HOME CARE INSTRUCTIONS Injury Care  If directed, apply ice to the injured area:  Put ice in a plastic bag.  Place a towel between your skin and the bag.  Leave the ice on for 20 minutes, 2-3 times a day.  Massage, stretch, and strengthen the affected area as directed by your health care provider. Activity  Rest as needed. Return to activity gradually as directed by your health care provider.  Restart your exercise sessions with non-weight-bearing exercises, such as cycling or swimming.  Stop running if the pain returns.  Warm up properly before exercising.  Run on a surface that is level and fairly firm.  Gradually change the intensity of an exercise.  If you increase your running distance, add only 5-10% to your distance each week. This means that if you are running 5 miles this week, you should only increase your run by - mile for next week. General Instructions  Take medicines only as directed by your health care provider.  Wear shoes that have rigid heels, shock absorption, and a good arch support.  Change your athletic shoes every 6 months, or every 350-450 miles. SEEK MEDICAL CARE IF:  Your symptoms continue or they worsen even after treatment.  The location, intensity, or type of pain changes over time.  You have swelling in your lower leg that gets worse.  Your shin becomes red and feels warm. SEEK IMMEDIATE MEDICAL CARE IF:  You have severe pain.  You have trouble walking.   This information is not intended to replace advice given to you by your health care provider. Make sure you discuss any questions you  have with your health care provider.   Document Released: 08/24/2000 Document Revised: 01/11/2015 Document Reviewed: 08/23/2014 Elsevier Interactive Patient Education [employer].

## 2015-11-17 NOTE — Progress Notes (Signed)
Chief Complaint  Patient presents with  . Leg Pain    for about a week or two, mostly at night when she is laying down.    1-2 weeks ago she started noticing pain in the RLE, below the knee, in the upper shin area. No pain with walking, but hurts when she lays down at night. Denies edema.  Rubbing it may seem to help some (this is what she does when in bed at night, along with stretching).  Can't truly find anything in particular that makes it better or worse. Pain is constant. Eventually she falls asleep.  Pain is gone in the morning.  The other day she had a twinge of pain while standing, otherwise is just at night.  She has a bruise below the right knee, but denies any injury, trauma. Pain doesn't radiate, very sharp, severe "ache". Sleeps with a pillow between her knees since L knee surgery many years ago.  Hasn't tried topical meds or ice or heat. She has taken Aleve with dinner (one), sometimes it helps, sometimes it doesn't, so hasn't been taking it every night.  Inconsistent with exercise, recently started walking again.  Got new sneakers, but same brand.  Wears supportive shoes during the day, no heels. Doesn't seem to correlate with exercise, whether or not she has walked.  She has been drinking more water.  No numbness/tingling, no back pain.  PMH, PSH, SH reviewed  Outpatient Encounter Prescriptions as of 11/17/2015  Medication Sig  . ALPRAZolam (XANAX) 0.25 MG tablet TAKE 1 OR 2 TABLET(S) BY MOUTH THREE TIMES DAILY AS NEEDED FOR ANXIETY  . aspirin 81 MG tablet Take 81 mg by mouth daily.  . Coenzyme Q10 (CO Q-10) 300 MG CAPS Take by mouth.  . levothyroxine (SYNTHROID, LEVOTHROID) 112 MCG tablet Take 1 tablet (112 mcg total) by mouth daily.  . Multiple Vitamin (MULTIVITAMIN) tablet Take 1 tablet by mouth daily.    . Naproxen Sodium (ALEVE PO) Take 220 mg by mouth as needed.  . sertraline (ZOLOFT) 100 MG tablet TAKE 1 TABLET (100 MG TOTAL) BY MOUTH DAILY.  . simvastatin  (ZOCOR) 80 MG tablet TAKE 1/2 TABLET (40 MG TOTAL) BY MOUTH AT BEDTIME.  . naproxen (NAPROSYN) 500 MG tablet Take 1 tablet (500 mg total) by mouth 2 (two) times daily with a meal.  . [DISCONTINUED] nitrofurantoin, macrocrystal-monohydrate, (MACROBID) 100 MG capsule Take 1 capsule (100 mg total) by mouth 2 (two) times daily.   No facility-administered encounter medications on file as of 11/17/2015.   (naproxen rx'd today, was taking OTC Aleve prior to visit).  Allergies  Allergen Reactions  . Demerol Nausea And Vomiting  . Penicillins Rash   ROS: no fever, chills, URI symptoms, chest pain, cough, shortness of breath, nausea, vomiting, bowel changes, heartburn, bleeding, bruising (always bruises easily per pt, unchanged). Denies other joint pains. No edema, numbness, tingling or other concerns.   PHYSICAL EXAM: BP 100/60 mmHg  Pulse 68  Ht 5\' 4"  (1.626 m)  Wt 152 lb (68.947 kg)  BMI 26.08 kg/m2  Well appearing, pleasant female in no distress Lower extremities: Mildly tender at upper mid tibia on the right. ?very slight inflammation noted--some asymmetry/slight swelling over tibia on the right compared to the left.  There appears to be subtle greenish discoloraiton from overlying vessels, more prominent over right tibia than over the left. No cords, no pitting edema. 2+ pulses No pain with dorsiflexion against resistance, or with stretch of anterior tib muscle.  ASSESSMENT/PLAN:  Right leg pain - Plan: naproxen (NAPROSYN) 500 MG tablet     Suspect strain/tendonitis/shin splint (though atypical in have such a focal/superior location) Trial of ice/heat/rest NSAIDs for up to 2 weeks Risks precautions reviewed

## 2015-12-05 ENCOUNTER — Other Ambulatory Visit: Payer: Self-pay | Admitting: Family Medicine

## 2015-12-08 ENCOUNTER — Encounter: Payer: Self-pay | Admitting: Family Medicine

## 2016-01-16 ENCOUNTER — Other Ambulatory Visit: Payer: Self-pay | Admitting: *Deleted

## 2016-01-16 ENCOUNTER — Encounter: Payer: Self-pay | Admitting: Family Medicine

## 2016-01-16 MED ORDER — ALPRAZOLAM 0.25 MG PO TABS: ORAL_TABLET | ORAL | Status: DC

## 2016-01-16 NOTE — Telephone Encounter (Signed)
Okay to refill #20.  Please send pt reply when done

## 2016-04-03 ENCOUNTER — Other Ambulatory Visit: Payer: Self-pay | Admitting: Family Medicine

## 2016-04-03 DIAGNOSIS — F411 Generalized anxiety disorder: Secondary | ICD-10-CM

## 2016-04-03 NOTE — Telephone Encounter (Signed)
Is this okay to refill? 

## 2016-04-03 NOTE — Telephone Encounter (Signed)
Okay to refill #90, but no refills. Last CPE was 05/2015 and she doesn't have one scheduled.  Needs to schedule CPE please

## 2016-04-03 NOTE — Telephone Encounter (Signed)
Left message for pt on machine to please call and make CPE for sept.

## 2016-05-07 ENCOUNTER — Ambulatory Visit (INDEPENDENT_AMBULATORY_CARE_PROVIDER_SITE_OTHER): Payer: PPO | Admitting: Family Medicine

## 2016-05-07 ENCOUNTER — Encounter: Payer: Self-pay | Admitting: Family Medicine

## 2016-05-07 VITALS — BP 108/70 | HR 80 | Temp 99.2°F | Ht 64.0 in | Wt 150.8 lb

## 2016-05-07 DIAGNOSIS — R350 Frequency of micturition: Secondary | ICD-10-CM | POA: Diagnosis not present

## 2016-05-07 DIAGNOSIS — Z23 Encounter for immunization: Secondary | ICD-10-CM

## 2016-05-07 DIAGNOSIS — N3000 Acute cystitis without hematuria: Secondary | ICD-10-CM | POA: Diagnosis not present

## 2016-05-07 LAB — POCT URINALYSIS DIPSTICK
BILIRUBIN UA: NEGATIVE
GLUCOSE UA: NEGATIVE
KETONES UA: NEGATIVE
Nitrite, UA: NEGATIVE
Urobilinogen, UA: NEGATIVE
pH, UA: 5.5

## 2016-05-07 MED ORDER — SULFAMETHOXAZOLE-TRIMETHOPRIM 800-160 MG PO TABS: 1.0000 | ORAL_TABLET | Freq: Two times a day (BID) | ORAL | 0 refills | Status: DC

## 2016-05-07 NOTE — Patient Instructions (Signed)
Start the antibiotic; take it twice daily. Call in 2 days if you aren't seeing any improvement or if worsening symptoms (fever, flank pain, vomiting, worsening urinary pain, etc). Otherwise, continue the antibiotics for the full week, and we will be in touch with the culture results when available.

## 2016-05-07 NOTE — Progress Notes (Signed)
Chief Complaint  Patient presents with  . Urinary Frequency    and pressure, no burning or pain x 4-5 days.    3-4 days ago she started with urinary frequency and urgency. Denies dysuria, hematuria. She describes a pressure in the lower abdomen. Feels like she empties her bladder well no incontinence. Went over 3 times within 1.5 hours this morning, which prompted her to contact us today.  Denies intercourse or masturbating. Wipes front to back. Denies holding urine; trying to drink more water  Denies vaginal discharge. Denies fever (low grade noted at visit, not felt by pt), no chills, just some fatigue. Denies flank pain.  Last UTI was 1 year ago.  Culture showed e.coli, sensitive to all antibiotics. She was treated with macrobid and hasn't had problems until last week.  Recently took 10d of antibiotics for gum surgery. She can't recall the name. Completed it 2 weeks ago.  PMH, PSH, SH reviewed.  Outpatient Encounter Prescriptions as of 05/07/2016  Medication Sig Note  . ALPRAZolam (XANAX) 0.25 MG tablet TAKE 1 OR 2 TABLET(S) BY MOUTH THREE TIMES DAILY AS NEEDED FOR ANXIETY   . aspirin 81 MG tablet Take 81 mg by mouth daily.   . Black Pepper-Turmeric (TURMERIC COMPLEX/BLACK PEPPER PO) Take 1 capsule by mouth daily. 05/07/2016: Also contains ginger and rosemary  . Coenzyme Q10 (CO Q-10) 300 MG CAPS Take by mouth.   . levothyroxine (SYNTHROID, LEVOTHROID) 112 MCG tablet Take 1 tablet (112 mcg total) by mouth daily.   . Multiple Vitamin (MULTIVITAMIN) tablet Take 1 tablet by mouth daily.     . sertraline (ZOLOFT) 100 MG tablet TAKE 1 TABLET (100 MG TOTAL) BY MOUTH DAILY.   . simvastatin (ZOCOR) 80 MG tablet TAKE 1/2 TABLET (40 MG TOTAL) BY MOUTH AT BEDTIME.   . Naproxen Sodium (ALEVE PO) Take 220 mg by mouth as needed.   . [DISCONTINUED] naproxen (NAPROSYN) 500 MG tablet Take 1 tablet (500 mg total) by mouth 2 (two) times daily with a meal.    No facility-administered encounter  medications on file as of 05/07/2016.    Allergies  Allergen Reactions  . Demerol Nausea And Vomiting  . Penicillins Rash   ROS: no fever, chills, headache, dizziness, URI symptoms, cough, shortness of breath, chest pain, nausea, vomiting, flank pain, diarrhea, bleeding, bruising, rash or other complaints, except as noted in HPI.  PHYSICAL EXAM: BP 108/70 (BP Location: Left Arm, Patient Position: Sitting, Cuff Size: Normal)   Pulse 80   Temp 99.2 F (37.3 C) (Tympanic)   Ht 5\' 4"  (1.626 m)   Wt 150 lb 12.8 oz (68.4 kg)   BMI 25.88 kg/m   Well appearing, pleasant female in no distress Back: no CVA tenderness Abdomen: soft, nontender (minimal tenderness suprapubically), no rebound, guarding, no organomegaly or mass Extremities: no edema Neuro: alert and oriented, cranial nerves intact, normal strength, gait Skin: normal turgor, no rash/lesions   Urine dip: 1+ leuks, 3+ blood, 1+ protein, neg nitrates  ASSESSMENT/PLAN:  Acute cystitis without hematuria - Plan: Urine culture, sulfamethoxazole-trimethoprim (BACTRIM DS,SEPTRA DS) 800-160 MG tablet  Urinary frequency - Plan: POCT Urinalysis Dipstick  Need for prophylactic vaccination and inoculation against influenza - Plan: Flu vaccine HIGH DOSE PF (Fluzone High dose)  Discussed cranberry juice, proper hygiene, staying well hydrated   F/u 2 weeks for lab u/a

## 2016-05-09 LAB — URINE CULTURE

## 2016-07-26 ENCOUNTER — Other Ambulatory Visit: Payer: Self-pay | Admitting: Family Medicine

## 2016-07-26 DIAGNOSIS — E039 Hypothyroidism, unspecified: Secondary | ICD-10-CM

## 2016-07-26 DIAGNOSIS — F411 Generalized anxiety disorder: Secondary | ICD-10-CM

## 2016-09-10 ENCOUNTER — Encounter: Payer: Self-pay | Admitting: Family Medicine

## 2016-10-23 DIAGNOSIS — H524 Presbyopia: Secondary | ICD-10-CM | POA: Diagnosis not present

## 2016-10-23 DIAGNOSIS — H52203 Unspecified astigmatism, bilateral: Secondary | ICD-10-CM | POA: Diagnosis not present

## 2016-10-23 DIAGNOSIS — H43813 Vitreous degeneration, bilateral: Secondary | ICD-10-CM | POA: Diagnosis not present

## 2016-10-23 DIAGNOSIS — H2513 Age-related nuclear cataract, bilateral: Secondary | ICD-10-CM | POA: Diagnosis not present

## 2016-10-23 DIAGNOSIS — H5213 Myopia, bilateral: Secondary | ICD-10-CM | POA: Diagnosis not present

## 2016-11-26 ENCOUNTER — Other Ambulatory Visit: Payer: Self-pay | Admitting: Family Medicine

## 2016-11-26 ENCOUNTER — Encounter: Payer: Self-pay | Admitting: Family Medicine

## 2016-11-26 DIAGNOSIS — F411 Generalized anxiety disorder: Secondary | ICD-10-CM

## 2016-11-26 MED ORDER — SIMVASTATIN 80 MG PO TABS: 80.0000 mg | ORAL_TABLET | Freq: Every day | ORAL | 0 refills | Status: DC

## 2016-11-26 MED ORDER — SERTRALINE HCL 100 MG PO TABS: 100.0000 mg | ORAL_TABLET | Freq: Every day | ORAL | 0 refills | Status: DC

## 2016-12-12 ENCOUNTER — Encounter: Payer: PPO | Admitting: Family Medicine

## 2016-12-25 NOTE — Progress Notes (Signed)
Chief Complaint  Patient presents with  . Medicare Wellness    fasting welcome to medicare visit with pap/CPE. Has had more heartburn recently. People tell her that she snores but she is unsure.     Olivia Cunningham is a 66 y.o. female who presents for Welcome to Medicare visit and follow-up on chronic medical conditions.    Hypothyroidism: She takes her thyroid medication when she first wakes up, not with food or other medications/vitamins. Takes vitamins and zoloft about 30-40 minutes later, when she eats.  Admits to missing her synthroid about 2x/week. Misses it when she sleeps later and runs behind, and eats right away, so doesn't take it. Denies any symptoms--just mild hair loss that she relates to menopause. No skin/bowel changes, weight, energy or mood changes Lab Results  Component Value Date   TSH 2.652 03/09/2015   Hyperlipidemia follow-up: Patient is reportedly following a low-fat, low cholesterol diet. Compliant with medications (78m, half of an 863mtab)  and denies medication side effects. Hasn't been getting any leg aches.  Takes coenzyme Q10 Lab Results  Component Value Date   CHOL 189 03/09/2015   HDL 51 03/09/2015   LDLCALC 108 (H) 03/09/2015   TRIG 152 (H) 03/09/2015   CHOLHDL 3.7 03/09/2015   Depression/Anxiety: She continues on the 10073mf zoloft, and feels like her moods are well controlled.  Denies any significant irritability, less frequent than in the past, even better since change in colleague (doing much better with the new rabbi).  Rarely needs alprazolam.   Immunization History  Administered Date(s) Administered  . Hepatitis A 11/06/2010  . Influenza, High Dose Seasonal PF 05/07/2016  . Influenza,inj,Quad PF,36+ Mos 09/21/2015  . Td 01/08/2006  . Tdap 12/09/2009  . Zoster 09/21/2015   ([facility] Hep[facility], 10/2010 given here) Last Pap smear: 02/2014, normal, no high risk HPV Last mammogram: 08/2015 Last colonoscopy: 07/2011 Dr. ManCollene Maresas  due again 2017 (prior h/o polyps) Last DEXA: approx 2006-7 (no record of results in chart) Dentist: twice yearly Ophtho: yearly Exercise: no regular exercise like she has in the past, plans when she retires in June.  Vitamin D-OH level 40 in 02/2013  Other doctors caring for patient include: GI: Dr. ManCollene Maresntist: Dr. NorMariea ClontsT: Dr. KraCresenciano Lickphtho: ShaGershon Craneepression screen:  Negative Fall screen: Negative Functional Status screen: negative See full screens in Epic  End of Life Discussion:  Patient has a living will and medical power of attorney   Past Medical History:  Diagnosis Date  . Achalasia   . Anxiety   . Cholesterol serum elevated   . Colon polyp    Dr. ManCollene Maresast colonoscopy 07/2011 normal (due again 2017)  . Depression   . Thyroid disease    HYPOTHYROID, NODULE    Past Surgical History:  Procedure Laterality Date  . ARTHROSCOPIC KNEE (LEFT) Left   . BREAST SURGERY     LEFT LUMPECTOMY (benign)  . ESOPHAGOGASTRODUODENOSCOPY (EGD) WITH ESOPHAGEAL DILATION  early 1980's   [facility]    Social History   Social History  . Marital status: Single    Spouse name: N/A  . Number of children: N/A  . Years of education: N/A   Occupational History  . exeDevelopment worker, international aid [employer]  . Smoking status: Never Smoker  . Smokeless tobacco: Never Used  . Alcohol use Yes     Comment:  0-1 glass of wine per week.  . Drug use: No  . Sexual activity: Not Currently   Other Topics Concern  . Not on file   Social History Narrative   Lives alone. No pets. 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).   Consulting (from home), plans to continue.    Family History  Problem Relation Age of Onset  . Hypertension Mother   . Heart disease Mother     CHF; CABG and valve replacement  . Cancer Mother     squamous cell cancer of tongue  . Arthritis Father   . Heart disease  Father     MI at 69, CABG at 70  . Cancer Sister     BREAST  . Breast cancer Sister     late 40's/early 48's (premenopausal)  . Heart disease Brother   . Bipolar disorder Brother   . Hyperlipidemia Brother   . Stroke Maternal Aunt 78  . Heart disease Maternal Grandmother   . Heart disease Maternal Grandfather   . Heart disease Paternal Grandmother   . Heart disease Paternal Grandfather   . Breast cancer Paternal Aunt 31  . Colon cancer Neg Hx   . Diabetes Neg Hx     Outpatient Encounter Prescriptions as of 12/26/2016  Medication Sig Note  . aspirin 81 MG tablet Take 81 mg by mouth daily.   . Black Pepper-Turmeric (TURMERIC COMPLEX/BLACK PEPPER PO) Take 1 capsule by mouth daily. 05/07/2016: Also contains ginger and rosemary  . Coenzyme Q10 (CO Q-10) 300 MG CAPS Take by mouth.   . levothyroxine (SYNTHROID, LEVOTHROID) 112 MCG tablet TAKE 1 TABLET (112 MCG TOTAL) BY MOUTH DAILY.   . Multiple Vitamin (MULTIVITAMIN) tablet Take 1 tablet by mouth daily.     . sertraline (ZOLOFT) 100 MG tablet Take 1 tablet (100 mg total) by mouth daily.   . simvastatin (ZOCOR) 80 MG tablet Take 1 tablet (80 mg total) by mouth daily at 6 PM. 12/26/2016: Taking 1/2 tablet daily  . ALPRAZolam (XANAX) 0.25 MG tablet TAKE 1 OR 2 TABLET(S) BY MOUTH THREE TIMES DAILY AS NEEDED FOR ANXIETY (Patient not taking: Reported on 12/26/2016) 12/26/2016: Rarely needs  . Naproxen Sodium (ALEVE PO) Take 220 mg by mouth as needed.   . [DISCONTINUED] sulfamethoxazole-trimethoprim (BACTRIM DS,SEPTRA DS) 800-160 MG tablet Take 1 tablet by mouth 2 (two) times daily.    No facility-administered encounter medications on file as of 12/26/2016.     Allergies  Allergen Reactions  . Demerol Nausea And Vomiting  . Penicillins Rash    ROS: The patient denies anorexia, fever, weight changes, headaches, vision changes, decreased hearing, ear pain, sore throat, breast concerns, chest pain, palpitations, dizziness, syncope, dyspnea on  exertion, cough, swelling, nausea, vomiting, diarrhea, constipation, abdominal pain, melena, hematochezia, indigestion/heartburn, hematuria, incontinence, dysuria, vaginal bleeding, discharge, odor or itch, genital lesions, joint pains, numbness, tingling, weakness, tremor, suspicious skin lesions, depression, anxiety, abnormal bleeding/bruising, or enlarged lymph nodes. Some watery eyes recently related to allergies, no other allergy symptoms yet. Some easy bruising on her legs, unchanged. She notes some increase in heartburn recently--had visitors and change in diet.  Improved now.   She has been told she snores; wakes up tired only when she tossed and turned at night.     PHYSICAL EXAM:  BP 120/76 (BP Location: Right Arm, Patient Position: Sitting, Cuff Size: Normal)   Pulse 68   Ht 5' 4.5" (1.638 m)   Wt 148 lb 9.6 oz (67.4 kg)  BMI 25.11 kg/m    Wt Readings from Last 3 Encounters:  05/07/16 150 lb 12.8 oz (68.4 kg)  11/17/15 152 lb (68.9 kg)  05/12/15 150 lb (68 kg)   General Appearance:   Alert, cooperative, no distress, appears stated age  Head:   Normocephalic, without obvious abnormality, atraumatic  Eyes:   PERRL, conjunctiva/corneas clear, EOM's intact, fundi benign  Ears:   Normal TM's and external ear canals.  Nose:  Nares normal, mucosa mildly edematous, no drainage or sinus tenderness  Throat:  Lips, mucosa, and tongue normal; teeth and gums normal  Neck:  Supple, no lymphadenopathy; thyroid:borderline size (slightly enlarged), unchanged; no tenderness/nodules; no carotid bruit or JVD  Back:  Spine nontender, no curvature, ROM normal, no CVA tenderness  Lungs:   Clear to auscultation bilaterally without wheezes, rales or ronchi; respirations unlabored  Chest Wall:   No tenderness or deformity  Heart:   Regular rate and rhythm, S1 and S2 normal, no murmur, rub or gallop  Breast Exam:   No masses, nipple discharge or inversion.  She does have fibroglandular changes noted bilaterally in UOQ's. There is a more prominent fibroglandular area in the right UOQ (10 o'clock), which is slightly tender. No axillary lymphadenopathy  Abdomen:   Soft, non-tender, nondistended, normoactive bowel sounds, no masses, no hepatosplenomegaly  Genitalia:   Normal external genitalia without lesions; atrophic changes noted. BUS and vagina normal; no cervical lesions or cervical motion tenderness. No abnormal vaginal discharge. Uterus and adnexa not enlarged, nontender, no masses. Pap performed  Rectal:   Normal tone, no masses or tenderness; guaiac negative stool  Extremities:  No clubbing, cyanosis or edema  Pulses:  2+ and symmetric all extremities  Skin:  Skin color, texture, turgor normal, no rashes or lesions  Lymph nodes:  Cervical, supraclavicular, and axillary nodes normal  Neurologic:  CNII-XII intact, normal strength, sensation and gait; reflexes 2+ and symmetric throughout  Psych: Normal mood, affect, hygiene and grooming    ASSESSMENT/PLAN:  Welcome to Medicare preventive visit - Plan: Cytology - PAP [place], EKG 12-Lead, POCT Urinalysis Dipstick  Pure hypercholesterolemia - Plan: Lipid panel, Comprehensive metabolic panel  Hypothyroidism, unspecified type - frequent missed doses; discussed how to properly take.  - Plan: TSH  Anxiety state - much improved since prior rabbi retired.  Rarely needs alprazolam  Major depression in remission [facility]) - Doing very well.  After retiring, if doing well, can consider trial of taper down/off zoloft (discussed how).  Need for hepatitis C screening test - Plan: Hepatitis C antibody  Medication monitoring encounter - Plan: Lipid panel, Comprehensive metabolic panel, CBC with Differential/Platelet, TSH  Immunization due - Plan: Pneumococcal conjugate vaccine 13-valent  Postmenopausal estrogen deficiency - Plan: DG Bone  Density   c-met, lipids, TSH, CBC, Hep C Ab  Pap performed EKG--sinus bradycardia, rate of 56, otherwise normal prevnar-13 given Shingrix recommended--to check insurance and get from pharmacy Past due for mammo--reminded to schedule, and to also schedule DEXA  Past due for colonoscopy--call Dr. Collene Mares to schedule   Hypothyroidism--some noncompliance with meds, no symptoms.  Await lab results before refilling. (If TSH is high, wouldn't adjust dose, but recheck in a few months when better about taking)   Discussed monthly self breast exams and yearly mammograms (past due and reminded to schedule); at least 30 minutes of aerobic activity at least 5 days/week, weight bearing exercise at least 2x/wk; proper sunscreen use reviewed; healthy diet, including goals of calcium and vitamin D intake and alcohol  recommendations (less than or equal to 1 drink/day) reviewed; regular seatbelt use; changing batteries in smoke detectors. Immunization recommendations discussed--continue yearly flu shots; Prevnar-13 today.  Recommended Shingrix. Risks/benefits reviewed.  Colonoscopy recommendations reviewed--past due   Try and take your multivitamin in the evening (and other supplements). If you are running late, still take the thyroid medication, even if conditions aren't ideal (meaning, with food).  Medicare Attestation I have personally reviewed: The patient's medical and social history Their use of alcohol, tobacco or illicit drugs Their current medications and supplements The patient's functional ability including ADLs,fall risks, home safety risks, cognitive, and hearing and visual impairment Diet and physical activities Evidence for depression or mood disorders  The patient's weight, height, and BMI have been recorded in the chart.  I have made referrals, counseling, and provided education to the patient based on review of the above and I have provided the patient with a written personalized care  plan for preventive services.

## 2016-12-26 ENCOUNTER — Other Ambulatory Visit (HOSPITAL_COMMUNITY)
Admission: RE | Admit: 2016-12-26 | Discharge: 2016-12-26 | Disposition: A | Discharge status: Home / Self Care | Payer: PPO | Source: Ambulatory Visit | Attending: Family Medicine | Admitting: Family Medicine

## 2016-12-26 ENCOUNTER — Encounter: Payer: Self-pay | Admitting: Family Medicine

## 2016-12-26 ENCOUNTER — Ambulatory Visit (INDEPENDENT_AMBULATORY_CARE_PROVIDER_SITE_OTHER): Payer: PPO | Admitting: Family Medicine

## 2016-12-26 VITALS — BP 120/76 | HR 68 | Ht 64.5 in | Wt 148.6 lb

## 2016-12-26 DIAGNOSIS — Z0000 Encounter for general adult medical examination without abnormal findings: Secondary | ICD-10-CM | POA: Diagnosis not present

## 2016-12-26 DIAGNOSIS — F325 Major depressive disorder, single episode, in full remission: Secondary | ICD-10-CM

## 2016-12-26 DIAGNOSIS — Z780 Asymptomatic menopausal state: Secondary | ICD-10-CM | POA: Diagnosis not present

## 2016-12-26 DIAGNOSIS — Z23 Encounter for immunization: Secondary | ICD-10-CM | POA: Diagnosis not present

## 2016-12-26 DIAGNOSIS — E7800 Pure hypercholesterolemia, unspecified: Secondary | ICD-10-CM | POA: Diagnosis not present

## 2016-12-26 DIAGNOSIS — Z1159 Encounter for screening for other viral diseases: Secondary | ICD-10-CM

## 2016-12-26 DIAGNOSIS — Z5181 Encounter for therapeutic drug level monitoring: Secondary | ICD-10-CM | POA: Diagnosis not present

## 2016-12-26 DIAGNOSIS — E039 Hypothyroidism, unspecified: Secondary | ICD-10-CM | POA: Diagnosis not present

## 2016-12-26 DIAGNOSIS — F411 Generalized anxiety disorder: Secondary | ICD-10-CM

## 2016-12-26 LAB — POCT URINALYSIS DIPSTICK
BILIRUBIN UA: NEGATIVE
GLUCOSE UA: NEGATIVE
KETONES UA: NEGATIVE
Leukocytes, UA: NEGATIVE
NITRITE UA: NEGATIVE
Protein, UA: NEGATIVE
Spec Grav, UA: 1.02 (ref 1.010–1.025)
Urobilinogen, UA: NEGATIVE E.U./dL — AB
pH, UA: 7 (ref 5.0–8.0)

## 2016-12-26 LAB — LIPID PANEL
Cholesterol: 193 mg/dL (ref <200)
HDL: 57 mg/dL (ref >50)
LDL Cholesterol: 110 mg/dL — ABNORMAL HIGH (ref <100)
TRIGLYCERIDES: 131 mg/dL (ref <150)
Total CHOL/HDL Ratio: 3.4 Ratio (ref <5.0)
VLDL: 26 mg/dL (ref <30)

## 2016-12-26 LAB — CBC WITH DIFFERENTIAL/PLATELET
BASOS ABS: 54 {cells}/uL (ref 0–200)
Basophils Relative: 1 %
EOS PCT: 2 %
Eosinophils Absolute: 108 cells/uL (ref 15–500)
HCT: 40.9 % (ref 35.0–45.0)
HEMOGLOBIN: 13.5 g/dL (ref 11.7–15.5)
LYMPHS ABS: 1944 {cells}/uL (ref 850–3900)
LYMPHS PCT: 36 %
MCH: 30.2 pg (ref 27.0–33.0)
MCHC: 33 g/dL (ref 32.0–36.0)
MCV: 91.5 fL (ref 80.0–100.0)
MONOS PCT: 9 %
MPV: 9.4 fL (ref 7.5–12.5)
Monocytes Absolute: 486 cells/uL (ref 200–950)
NEUTROS PCT: 52 %
Neutro Abs: 2808 cells/uL (ref 1500–7800)
Platelets: 228 10*3/uL (ref 140–400)
RBC: 4.47 MIL/uL (ref 3.80–5.10)
RDW: 13.1 % (ref 11.0–15.0)
WBC: 5.4 10*3/uL (ref 4.0–10.5)

## 2016-12-26 LAB — COMPREHENSIVE METABOLIC PANEL
ALK PHOS: 46 U/L (ref 33–130)
ALT: 10 U/L (ref 6–29)
AST: 15 U/L (ref 10–35)
Albumin: 4.1 g/dL (ref 3.6–5.1)
BILIRUBIN TOTAL: 0.4 mg/dL (ref 0.2–1.2)
BUN: 19 mg/dL (ref 7–25)
CO2: 27 mmol/L (ref 20–31)
CREATININE: 0.93 mg/dL (ref 0.50–0.99)
Calcium: 9.3 mg/dL (ref 8.6–10.4)
Chloride: 103 mmol/L (ref 98–110)
GLUCOSE: 93 mg/dL (ref 65–99)
Potassium: 4.5 mmol/L (ref 3.5–5.3)
SODIUM: 137 mmol/L (ref 135–146)
TOTAL PROTEIN: 7.2 g/dL (ref 6.1–8.1)

## 2016-12-26 NOTE — Patient Instructions (Signed)
HEALTH MAINTENANCE RECOMMENDATIONS:  It is recommended that you get at least 30 minutes of aerobic exercise at least 5 days/week (for weight loss, you may need as much as 60-90 minutes). This can be any activity that gets your heart rate up. This can be divided in 10-15 minute intervals if needed, but try and build up your endurance at least once a week.  Weight bearing exercise is also recommended twice weekly.  Eat a healthy diet with lots of vegetables, fruits and fiber.  "Colorful" foods have a lot of vitamins (ie green vegetables, tomatoes, red peppers, etc).  Limit sweet tea, regular sodas and alcoholic beverages, all of which has a lot of calories and sugar.  Up to 1 alcoholic drink daily may be beneficial for women (unless trying to lose weight, watch sugars).  Drink a lot of water.  Calcium recommendations are 1200-1500 mg daily (1500 mg for postmenopausal women or women without ovaries), and vitamin D 1000 IU daily.  This should be obtained from diet and/or supplements (vitamins), and calcium should not be taken all at once, but in divided doses.  Monthly self breast exams and yearly mammograms for women over the age of 73 is recommended.  Sunscreen of at least SPF 30 should be used on all sun-exposed parts of the skin when outside between the hours of 10 am and 4 pm (not just when at beach or pool, but even with exercise, golf, tennis, and yard work!)  Use a sunscreen that says "broad spectrum" so it covers both UVA and UVB rays, and make sure to reapply every 1-2 hours.  Remember to change the batteries in your smoke detectors when changing your clock times in the spring and fall.  Use your seat belt every time you are in a car, and please drive safely and not be distracted with cell phones and texting while driving.   Olivia Cunningham , Thank you for taking time to come for your Welcome to Medicare Visit. I appreciate your ongoing commitment to your health goals. Please review the  following plan we discussed and let me know if I can assist you in the future.   These are the goals we discussed: Goals    None      This is a list of the screening recommended for you and due dates:  Health Maintenance  Topic Date Due  .  Hepatitis C: One time screening is recommended by Center for Disease Control  (CDC) for  adults born from [birth year] through 1965.   [birth year]  . HIV Screening  01/01/1966  . DEXA scan (bone density measurement)  01/02/2016  . Pneumonia vaccines (1 of 2 - PCV13) 01/02/2016  . Pap Smear  02/22/2017  . Flu Shot  04/10/2017  . Mammogram  08/30/2017  . Tetanus Vaccine  12/10/2019  . Colon Cancer Screening  07/24/2021   Hepatitis C screen is being done today. Olivia Cunningham is given today Bone density is being ordered--please call the breast center to schedule both your bone density and your mammogram (mammo is due NOW, not 08/2017 as stated above). Colonoscopy was due 07/2016 (not 2022 as stated above)--please contact Dr. Lorie Apley office to schedule. Pap smear was done today.  I recommend getting the new shingles vaccine (Shingrix). You will need to check with your insurance to see if it is covered, and if covered by Medicare Part D, you need to get from the pharmacy rather than our office.  It is a series of 2 injections, spaced  2 months apart.

## 2016-12-27 ENCOUNTER — Telehealth: Payer: Self-pay | Admitting: Family Medicine

## 2016-12-27 ENCOUNTER — Encounter: Payer: Self-pay | Admitting: Family Medicine

## 2016-12-27 LAB — HEPATITIS C ANTIBODY: HCV Ab: NEGATIVE

## 2016-12-27 LAB — TSH: TSH: 1.74 m[IU]/L

## 2016-12-27 MED ORDER — LEVOTHYROXINE SODIUM 112 MCG PO TABS: 112.0000 ug | ORAL_TABLET | Freq: Every day | ORAL | 3 refills | Status: DC

## 2016-12-27 NOTE — Telephone Encounter (Signed)
[place] called to let Dr Tomi Bamberger know that the generic brand is changing on Levothyroxine med. They will proceed with refilling med as normal unless they hear otherwise from our office.

## 2016-12-27 NOTE — Telephone Encounter (Signed)
Left message for patient to call me back. 

## 2016-12-27 NOTE — Addendum Note (Signed)
Addended byRita Ohara on: 12/27/2016 07:51 AM   Modules accepted: Orders

## 2016-12-27 NOTE — Telephone Encounter (Signed)
Advise pt of this info as well.  Any time a generic is changed, there is the potential that it may not work to the same strength exactly.  If she starts having any thyroid symptoms, then she should contact us and needs to have another TSH checked.

## 2016-12-28 LAB — CYTOLOGY - PAP
Diagnosis: NEGATIVE
HPV: NOT DETECTED

## 2017-04-08 ENCOUNTER — Other Ambulatory Visit: Payer: Self-pay | Admitting: Family Medicine

## 2017-04-08 DIAGNOSIS — F411 Generalized anxiety disorder: Secondary | ICD-10-CM

## 2017-05-31 ENCOUNTER — Other Ambulatory Visit: Payer: Self-pay | Admitting: Family Medicine

## 2017-05-31 MED ORDER — SIMVASTATIN 80 MG PO TABS: 80.0000 mg | ORAL_TABLET | Freq: Every day | ORAL | 0 refills | Status: DC

## 2017-07-15 ENCOUNTER — Encounter: Payer: PPO | Admitting: Family Medicine

## 2017-08-12 ENCOUNTER — Other Ambulatory Visit: Payer: Self-pay | Admitting: Family Medicine

## 2017-08-12 DIAGNOSIS — Z139 Encounter for screening, unspecified: Secondary | ICD-10-CM

## 2017-09-11 ENCOUNTER — Ambulatory Visit
Admission: RE | Admit: 2017-09-11 | Discharge: 2017-09-11 | Disposition: A | Discharge status: Home / Self Care | Payer: PPO | Source: Ambulatory Visit | Attending: Family Medicine | Admitting: Family Medicine

## 2017-09-11 DIAGNOSIS — M85852 Other specified disorders of bone density and structure, left thigh: Secondary | ICD-10-CM | POA: Diagnosis not present

## 2017-09-11 DIAGNOSIS — Z1231 Encounter for screening mammogram for malignant neoplasm of breast: Secondary | ICD-10-CM | POA: Diagnosis not present

## 2017-09-11 DIAGNOSIS — Z780 Asymptomatic menopausal state: Secondary | ICD-10-CM

## 2017-09-11 DIAGNOSIS — Z139 Encounter for screening, unspecified: Secondary | ICD-10-CM

## 2017-10-29 DIAGNOSIS — H524 Presbyopia: Secondary | ICD-10-CM | POA: Diagnosis not present

## 2017-10-29 DIAGNOSIS — H52203 Unspecified astigmatism, bilateral: Secondary | ICD-10-CM | POA: Diagnosis not present

## 2017-10-29 DIAGNOSIS — H2513 Age-related nuclear cataract, bilateral: Secondary | ICD-10-CM | POA: Diagnosis not present

## 2017-10-29 DIAGNOSIS — H5213 Myopia, bilateral: Secondary | ICD-10-CM | POA: Diagnosis not present

## 2018-01-26 NOTE — Progress Notes (Deleted)
Olivia Cunningham is a 67 y.o. female who presents for annual wellness visit and follow-up on chronic medical conditions.  She has the following concerns:  Hypothyroidism: She takes her thyroid medication when she first wakes up, not with food or other medications/vitamins.   ??update needed? Takes vitamins and zoloft about 30-40 minutes later, when she eats. Denies any symptoms--just mild hair loss that she relates to menopause. No skin/bowel changes, weight, energy or mood changes Lab Results  Component Value Date   TSH 1.74 12/26/2016   Hyperlipidemia follow-up: Patient is reportedly following a low-fat, low cholesterol diet. Compliant with simvastatin (10m, half of an 852mtab)  and denies medication side effects. Hasn't been getting any leg aches.  Takes coenzyme Q10 Lab Results  Component Value Date   CHOL 193 12/26/2016   HDL 57 12/26/2016   LDLCALC 110 (H) 12/26/2016   TRIG 131 12/26/2016   CHOLHDL 3.4 12/26/2016    Depression/Anxiety: She continues on the 10075mf zoloft, and feels like her moods are well controlled. Denies any significant irritability, rarely needs alprazolam (last rx'd #20 in 01/2016).    Immunization History  Administered Date(s) Administered  . Hepatitis A 11/06/2010  . Influenza, High Dose Seasonal PF 05/07/2016  . Influenza,inj,Quad PF,6+ Mos 09/21/2015  . Pneumococcal Conjugate-13 12/26/2016  . Td 01/08/2006  . Tdap 12/09/2009  . Zoster 09/21/2015   ([facility] Hep[facility], 10/2010 given here) Last Pap smear: 12/2016, normal, no high risk HPV Last mammogram: 09/2017 Last colonoscopy: 07/2011 Dr. ManCollene Maresas due again 2017 (prior h/o polyps) Last DEXA: 09/2017: T-1.4 L fem neck Dentist: twice yearly Ophtho: yearly Exercise: walks  Vitamin D-OH level 40 in 02/2013  Other doctors caring for patient include: GI: Dr. ManCollene Maresntist: Dr. NorMariea ClontsT: Dr. KraCresenciano Lickphtho: ShaGershon Craneepression screen:  Negative Fall screen:  Negative Functional Status screen: negative Mini-Cog screen: normal (score of 5) See full screens in Epic  End of Life Discussion:  Patient has a living will and medical power of attorney     ROS: The patient denies anorexia, fever, weight changes, headaches, vision changes, decreased hearing, ear pain, sore throat, breast concerns, chest pain, palpitations, dizziness, syncope, dyspnea on exertion, cough, swelling, nausea, vomiting, diarrhea, constipation, abdominal pain, melena, hematochezia, indigestion/heartburn, hematuria, incontinence, dysuria, vaginal bleeding, discharge, odor or itch, genital lesions, joint pains, numbness, tingling, weakness, tremor, suspicious skin lesions, depression, anxiety, abnormal bleeding/bruising, or enlarged lymph nodes.  She has been told she snores; wakes up tired only when she tossed and turned at night.    .  PHYSICAL EXAM:  Wt Readings from Last 3 Encounters:  12/26/16 148 lb 9.6 oz (67.4 kg)  05/07/16 150 lb 12.8 oz (68.4 kg)  11/17/15 152 lb (68.9 kg)   General Appearance:   Alert, cooperative, no distress, appears stated age  Head:   Normocephalic, without obvious abnormality, atraumatic  Eyes:   PERRL, conjunctiva/corneas clear, EOM's intact, fundi benign  Ears:   Normal TM's and external ear canals.  Nose:  Nares normal, mucosa mildly edematous, no drainage or sinus tenderness  Throat:  Lips, mucosa, and tongue normal; teeth and gums normal  Neck:  Supple, no lymphadenopathy; thyroid:borderline size (slightly enlarged), unchanged; no tenderness/nodules; no carotid bruit or JVD  Back:  Spine nontender, no curvature, ROM normal, no CVA tenderness  Lungs:   Clear to auscultation bilaterally without wheezes, rales or ronchi; respirations unlabored  Chest Wall:   No tenderness or deformity  Heart:  Regular rate and rhythm, S1 and S2 normal, no murmur, rub or gallop  Breast Exam:   No masses,  nipple discharge or inversion. She does have fibroglandular changes noted bilaterally in UOQ's. There is a more prominent fibroglandular area in the right UOQ (10 o'clock), which is slightly tender. No axillary lymphadenopathy  Abdomen:   Soft, non-tender, nondistended, normoactive bowel sounds, no masses, no hepatosplenomegaly  Genitalia:   Normal external genitalia without lesions; atrophic changes noted. BUS and vagina normal; no cervical motion tenderness. No abnormal vaginal discharge. Uterus and adnexa not enlarged, nontender, no masses. Pap not performed  Rectal:   Normal tone, no masses or tenderness; guaiac negative stool  Extremities:  No clubbing, cyanosis or edema  Pulses:  2+ and symmetric all extremities  Skin:  Skin color, texture, turgor normal, no rashes or lesions  Lymph nodes:  Cervical, supraclavicular, and axillary nodes normal  Neurologic:  CNII-XII intact, normal strength, sensation and gait; reflexes 2+ and symmetric throughout  Psych: Normal mood, affect, hygiene and grooming   UPDATE BREAST EXAM  ASSESSMENT/PLAN:  Pneumovax  c-met, lipids, TSH, CBC Vit D poss  Discussed monthly self breast exams and yearly mammograms (past due and reminded to schedule); at least 30 minutes of aerobic activity at least 5 days/week, weight bearing exercise at least 2x/wk; proper sunscreen use reviewed; healthy diet, including goals of calcium and vitamin D intake and alcohol recommendations (less than or equal to 1 drink/day) reviewed; regular seatbelt use; changing batteries in smoke detectors. Immunization recommendations discussed--continue yearly flu shots; Pneumovax today.  Recommended Shingrix, to get from pharmacy. Risks/benefits reviewed. Colonoscopy recommendations reviewed --PAST DUE??    Medicare Attestation I have personally reviewed: The patient's medical and social history Their use of alcohol, tobacco or  illicit drugs Their current medications and supplements The patient's functional ability including ADLs,fall risks, home safety risks, cognitive, and hearing and visual impairment Diet and physical activities Evidence for depression or mood disorders  The patient's weight, height and BMI have been recorded in the chart.  I have made referrals, counseling, and provided education to the patient based on review of the above and I have provided the patient with a written personalized care plan for preventive services.

## 2018-01-29 ENCOUNTER — Ambulatory Visit: Payer: PPO | Admitting: Family Medicine

## 2018-01-30 ENCOUNTER — Other Ambulatory Visit: Payer: Self-pay | Admitting: Family Medicine

## 2018-01-30 NOTE — Telephone Encounter (Signed)
No.  Needs med check and/or cancellation list

## 2018-01-30 NOTE — Telephone Encounter (Signed)
Canceled appt for yesterday AWV, rescheduled for Dec-is this ok?

## 2018-02-18 NOTE — Progress Notes (Signed)
Chief Complaint  Patient presents with  . AMV    patient is fasting for medicare wellness visit   Olivia Cunningham is a 67 y.o. female who presents for annual wellness visit and follow-up on chronic medical conditions.    Hypothyroidism:She takes her thyroid medication when she first wakes up,not with food or other medications/vitamins.  Takes zoloft and baby aspirin 60-90 minutes later, changed CoQ10, turmeric and MVI to dinnertime. Denies any symptoms--just mild hair loss that she relates to menopause. No skin/bowel changes, weight, energy or mood changes Lab Results  Component Value Date   TSH 1.74 12/26/2016   Hyperlipidemia follow-up: Patient is reportedly following a low-fat, low cholesterol diet. Compliant with simvastatin(40mg , half of an 80mg  tab)and denies medication side effects. Hasn't been getting any leg aches. Takes coenzyme Q10 Lab Results  Component Value Date   CHOL 193 12/26/2016   HDL 57 12/26/2016   LDLCALC 110 (H) 12/26/2016   TRIG 131 12/26/2016   CHOLHDL 3.4 12/26/2016   Depression/Anxiety: She decreased zoloft dose back to 50mg  after she retired, hasn't noticed any increase in symptoms.  She still does some consulting and traveling periodically. Moods are well controlled. Hasn't needed any alprazolam.  She no longer even stresses when stuck in traffic.  Immunization History  Administered Date(s) Administered  . Hepatitis A 11/06/2010  . Influenza, High Dose Seasonal PF 05/07/2016  . Influenza,inj,Quad PF,6+ Mos 09/21/2015  . Pneumococcal Conjugate-13 12/26/2016  . Td 01/08/2006  . Tdap 12/09/2009  . Zoster 09/21/2015   ([facility] Hep[facility], 10/2010 given here)  Didn't get a flu shot this year. Last Pap smear: 12/2016, normal, no high risk HPV Last mammogram:09/2017 Last colonoscopy: 07/2011 Dr. Marquette Old again 2017(prior h/o polyps) Last DEXA: 09/2017: T-1.4 L fem neck Dentist: twice yearly Ophtho: yearly Exercise:walks 30  minutes 4-5x/week, sometimes longer. Sometimes uses weights at home  Vitamin D-OH level 40 in 02/2013  Other doctors caring for patient include: GI: Dr. Collene Mares Dentist: Dr. Mariea Clonts ENT: Dr. Cresenciano Lick  ophtho: Dr. Gershon Crane Ortho: Prev saw Dr. Alphonzo Cruise and PA at [facility]  Depression screen:Negative Fall screen: Negative Functional Status screen: negative Mini-Cog screen: initially was 3/5 (missed 2 on recall); repeat was 5/5 (different words were used, though she could still recall the earlier 3) See full screens in Epic  End of Life Discussion: Patient hasa living will and medical power of attorney  Past Medical History:  Diagnosis Date  . Achalasia   . Anxiety   . Cholesterol serum elevated   . Colon polyp    Dr. Collene Mares; last colonoscopy 07/2011 normal (due again 2017)  . Depression   . Thyroid disease    HYPOTHYROID, NODULE    Past Surgical History:  Procedure Laterality Date  . ARTHROSCOPIC KNEE (LEFT) Left   . BREAST SURGERY     LEFT LUMPECTOMY (benign)  . ESOPHAGOGASTRODUODENOSCOPY (EGD) WITH ESOPHAGEAL DILATION  early 1980's   x3 at [facility] (treating achalasia with stretching)  . WISDOM TOOTH EXTRACTION      Social History   Socioeconomic History  . Marital status: Single    Spouse name: Not on file  . Number of children: Not on file  . Years of education: Not on file  . Highest education level: Not on file  Occupational History  . Occupation: Development worker, international aid at [employer]  . Financial resource strain: Not on file  . Food insecurity:    Worry: Not on file  Inability: Not on file  . Transportation needs:    Medical: Not on file    Non-medical: Not on file  Tobacco Use  . Smoking status: Never Smoker  . Smokeless tobacco: Never Used  Substance and Sexual Activity  . Alcohol use: Yes    Comment: 0-2 glass of wine per week.  . Drug use: No  . Sexual activity: Not Currently  Lifestyle  . Physical activity:    Days per  week: Not on file    Minutes per session: Not on file  . Stress: Not on file  Relationships  . Social connections:    Talks on phone: Not on file    Gets together: Not on file    Attends religious service: Not on file    Active member of club or organization: Not on file    Attends meetings of clubs or organizations: Not on file    Relationship status: Not on file  . Intimate partner violence:    Fear of current or ex partner: Not on file    Emotionally abused: Not on file    Physically abused: Not on file    Forced sexual activity: Not on file  Other Topics Concern  . Not on file  Social History Narrative   Lives alone. No pets. 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).   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 Hypertension Mother   . Heart disease Mother        CHF; CABG and valve replacement  . Cancer Mother        squamous cell cancer of tongue  . Arthritis Father   . Heart disease Father        MI at 89, CABG at 64  . Cancer Sister        BREAST  . Breast cancer Sister        late 40's/early 74's (premenopausal)  . Heart disease Brother   . Bipolar disorder Brother   . Hyperlipidemia Brother   . Stroke Maternal Aunt 78  . Heart disease Maternal Grandmother   . Heart disease Maternal Grandfather   . Heart disease Paternal Grandmother   . Heart disease Paternal Grandfather   . Breast cancer Paternal Aunt 57  . Colon cancer Neg Hx   . Diabetes Neg Hx     Outpatient Encounter Medications as of 02/20/2018  Medication Sig Note  . aspirin 81 MG tablet Take 81 mg by mouth daily.   . Black Pepper-Turmeric (TURMERIC COMPLEX/BLACK PEPPER PO) Take 1 capsule by mouth daily. 05/07/2016: Also contains ginger and rosemary  . Coenzyme Q10 (CO Q-10) 300 MG CAPS Take by mouth.   . levothyroxine (SYNTHROID, LEVOTHROID) 112 MCG tablet Take 1 tablet (112 mcg total) by mouth daily.   . Multiple Vitamin (MULTIVITAMIN)  tablet Take 1 tablet by mouth daily.     . sertraline (ZOLOFT) 100 MG tablet TAKE ONE TABLET BY MOUTH DAILY 02/20/2018: Taking 1/2 tablet daily  . simvastatin (ZOCOR) 80 MG tablet TAKE ONE TABLET BY MOUTH EVERY EVENING AT 6PM 02/20/2018: Takes 1/2 tablet daily  . Naproxen Sodium (ALEVE PO) Take 220 mg by mouth as needed.   . [DISCONTINUED] ALPRAZolam (XANAX) 0.25 MG tablet TAKE 1 OR 2 TABLET(S) BY MOUTH THREE TIMES DAILY AS NEEDED FOR ANXIETY (Patient not taking: Reported on 12/26/2016) 12/26/2016: Rarely needs   No facility-administered encounter medications on file as of 02/20/2018.  Allergies  Allergen Reactions  . Demerol Nausea And Vomiting  . Penicillins Rash    ROS: The patient denies anorexia, fever, weight changes, headaches, vision changes, decreased hearing, ear pain, sore throat, breast concerns, chest pain, palpitations, dizziness, syncope, dyspnea on exertion, cough, swelling, nausea, vomiting, diarrhea, constipation, abdominal pain, melena, hematochezia, hematuria, incontinence, dysuria, vaginal bleeding, discharge, odor or itch, genital lesions, joint pains, numbness, tingling, weakness, tremor, suspicious skin lesions, depression, anxiety, abnormal bleeding/bruising, or enlarged lymph nodes. occasional heartburn  She has been told she snores; wakes up tired only when she tossed and turned at night.   .  PHYSICAL EXAM:  BP 110/70   Pulse 72   Resp 16   Ht 5\' 4"  (1.626 m)   Wt 153 lb 3.2 oz (69.5 kg)   SpO2 98%   BMI 26.30 kg/m   Wt Readings from Last 3 Encounters:  02/20/18 153 lb 3.2 oz (69.5 kg)  12/26/16 148 lb 9.6 oz (67.4 kg)  05/07/16 150 lb 12.8 oz (68.4 kg)     General Appearance:   Alert, cooperative, no distress, appears stated age  Head:   Normocephalic, without obvious abnormality, atraumatic  Eyes:   PERRL, conjunctiva/corneas clear, EOM's intact, fundi benign  Ears:   Normal TM's and external ear canals.  Nose:  Nares  normal, mucosa mildly edematous, no drainage or sinus tenderness  Throat:  Lips, mucosa, and tongue normal; teeth and gums normal  Neck:  Supple, no lymphadenopathy; thyroid:normal size, no tenderness/nodules; no carotidbruit or JVD  Back:  Spine nontender, no curvature, ROM normal, no CVA tenderness  Lungs:   Clear to auscultation bilaterally without wheezes, rales or ronchi; respirations unlabored  Chest Wall:   No tenderness or deformity  Heart:   Regular rate and rhythm, S1 and S2 normal, no murmur, rub or gallop  Breast Exam:   No masses, nipple discharge or inversion. She does have fibroglandular changes noted bilaterally in UOQ's. There is a more prominent fibroglandular area in the right UOQ (10 o'clock), which is slightly tender. This is the same area that had palpable tender area last year; possibly a little larger.  No axillary lymphadenopathy  Abdomen:   Soft, non-tender, nondistended, normoactive bowel sounds, no masses, no hepatosplenomegaly  Genitalia:   Normal external genitalia without lesions; atrophic changes noted. BUS and vagina normal; no cervical motion tenderness. No abnormal vaginal discharge. Uterus and adnexa not enlarged, nontender, no masses. Pap not performed  Rectal:   Normal tone, no masses or tenderness; guaiac negative stool. Inflamed, nontender external hemorrhoid  Extremities:  No clubbing, cyanosis or edema  Pulses:  2+ and symmetric all extremities  Skin:  Skin color, texture, turgor normal, no rashes or lesions  Lymph nodes:  Cervical, supraclavicular, and axillary nodes normal  Neurologic:  CNII-XII intact, normal strength, sensation and gait; reflexes 2+ and symmetric throughout  Psych: Normal mood, affect, hygiene and grooming   ASSESSMENT/PLAN:  Annual physical exam - Plan: Comprehensive metabolic panel, Lipid panel, TSH, CBC with Differential/Platelet  Medicare  annual wellness visit, initial  Pure hypercholesterolemia - Plan: Lipid panel  Hypothyroidism, unspecified type - Plan: TSH  Major depression in remission ([place]) - doing well on 50mg  zoloft; discussed potential to taper down further. Recently fill 90d 100mg  tab; will change to 50mg  with next refill, so can try taper down  Anxiety state  Immunization due - Plan: Pneumococcal polysaccharide vaccine 23-valent greater than or equal to 2yo subcutaneous/IM  Medication monitoring encounter - Plan: Comprehensive metabolic panel,  Lipid panel, TSH, CBC with Differential/Platelet  Breast mass, right - normal screening mammo in January, but given ongoing palpable abnl (?larger), will eval with diagnostic mammo and [place] - Plan: CANCELED: US BREAST LTD UNI RIGHT INC AXILLA, CANCELED: DG BONE DENSITY (DXA)  Hx of abnormal mammogram - Plan: CANCELED: MM DIAG BREAST TOMO BILATERAL, CANCELED: US BREAST LTD UNI RIGHT INC AXILLA, CANCELED: MM DIAG BREAST TOMO UNI RIGHT, CANCELED: DG BONE DENSITY (DXA)   Discussed monthly self breast exams and yearly mammograms; at least 30 minutes of aerobic activity at least 5 days/week, weight bearing exercise at least 2x/wk; proper sunscreen use reviewed; healthy diet, including goals of calcium and vitamin D intake and alcohol recommendations (less than or equal to 1 drink/day) reviewed; regular seatbelt use; changing batteries in smoke detectors. Immunization recommendations discussed--continueyearly flu shots; Pneumovax given today. Recommended Shingrix, to get from pharmacy. Risks/benefits reviewed. Colonoscopy recommendations reviewed --to check with Dr. Collene Mares if due now, or if rec changed from 5 year to 10 year and still UTD.  Consider changing to 50mg  zoloft tablet with next refill so she can try to further taper to 25mg  and potentially off.  Just got 90d filled.  Contact us prior to your next refill and let us know if you want [place] to change it to the 50mg  (so that  you can split them if desired).   Medicare Attestation I have personally reviewed: The patient's medical and social history Their use of alcohol, tobacco or illicit drugs Their current medications and supplements The patient's functional ability including ADLs,fall risks, home safety risks, cognitive, and hearing and visual impairment Diet and physical activities Evidence for depression or mood disorders  The patient's weight, height and BMI have been recorded in the chart.  I have made referrals, counseling, and provided education to the patient based on review of the above and I have provided the patient with a written personalized care plan for preventive services.

## 2018-02-20 ENCOUNTER — Other Ambulatory Visit: Payer: Self-pay | Admitting: Family Medicine

## 2018-02-20 ENCOUNTER — Encounter: Payer: Self-pay | Admitting: Family Medicine

## 2018-02-20 ENCOUNTER — Ambulatory Visit (INDEPENDENT_AMBULATORY_CARE_PROVIDER_SITE_OTHER): Payer: PPO | Admitting: Family Medicine

## 2018-02-20 ENCOUNTER — Telehealth: Payer: Self-pay

## 2018-02-20 VITALS — BP 110/70 | HR 72 | Resp 16 | Ht 64.0 in | Wt 153.2 lb

## 2018-02-20 DIAGNOSIS — Z23 Encounter for immunization: Secondary | ICD-10-CM

## 2018-02-20 DIAGNOSIS — E7800 Pure hypercholesterolemia, unspecified: Secondary | ICD-10-CM

## 2018-02-20 DIAGNOSIS — N6310 Unspecified lump in the right breast, unspecified quadrant: Secondary | ICD-10-CM | POA: Diagnosis not present

## 2018-02-20 DIAGNOSIS — E039 Hypothyroidism, unspecified: Secondary | ICD-10-CM

## 2018-02-20 DIAGNOSIS — Z0000 Encounter for general adult medical examination without abnormal findings: Secondary | ICD-10-CM | POA: Diagnosis not present

## 2018-02-20 DIAGNOSIS — Z87898 Personal history of other specified conditions: Secondary | ICD-10-CM | POA: Diagnosis not present

## 2018-02-20 DIAGNOSIS — Z5181 Encounter for therapeutic drug level monitoring: Secondary | ICD-10-CM

## 2018-02-20 DIAGNOSIS — F411 Generalized anxiety disorder: Secondary | ICD-10-CM

## 2018-02-20 DIAGNOSIS — F325 Major depressive disorder, single episode, in full remission: Secondary | ICD-10-CM

## 2018-02-20 DIAGNOSIS — N644 Mastodynia: Secondary | ICD-10-CM

## 2018-02-20 NOTE — Patient Instructions (Addendum)
HEALTH MAINTENANCE RECOMMENDATIONS:  It is recommended that you get at least 30 minutes of aerobic exercise at least 5 days/week (for weight loss, you may need as much as 60-90 minutes). This can be any activity that gets your heart rate up. This can be divided in 10-15 minute intervals if needed, but try and build up your endurance at least once a week.  Weight bearing exercise is also recommended twice weekly.  Eat a healthy diet with lots of vegetables, fruits and fiber.  "Colorful" foods have a lot of vitamins (ie green vegetables, tomatoes, red peppers, etc).  Limit sweet tea, regular sodas and alcoholic beverages, all of which has a lot of calories and sugar.  Up to 1 alcoholic drink daily may be beneficial for women (unless trying to lose weight, watch sugars).  Drink a lot of water.  Calcium recommendations are 1200-1500 mg daily (1500 mg for postmenopausal women or women without ovaries), and vitamin D 1000 IU daily.  This should be obtained from diet and/or supplements (vitamins), and calcium should not be taken all at once, but in divided doses.  Monthly self breast exams and yearly mammograms for women over the age of 50 is recommended.  Sunscreen of at least SPF 30 should be used on all sun-exposed parts of the skin when outside between the hours of 10 am and 4 pm (not just when at beach or pool, but even with exercise, golf, tennis, and yard work!)  Use a sunscreen that says "broad spectrum" so it covers both UVA and UVB rays, and make sure to reapply every 1-2 hours.  Remember to change the batteries in your smoke detectors when changing your clock times in the spring and fall.  Use your seat belt every time you are in a car, and please drive safely and not be distracted with cell phones and texting while driving.  I recommend carbon monoxide detector for your home.   Olivia Cunningham , Thank you for taking time to come for your Medicare Wellness Visit. I appreciate your ongoing  commitment to your health goals. Please review the following plan we discussed and let me know if I can assist you in the future.   These are the goals we discussed: Goals    None      This is a list of the screening recommended for you and due dates:  Health Maintenance  Topic Date Due  . Pneumonia vaccines (2 of 2 - PPSV23) 12/26/2017  . Flu Shot  04/10/2018  . Mammogram  09/12/2019  . Tetanus Vaccine  12/10/2019  . Colon Cancer Screening  07/24/2021  . DEXA scan (bone density measurement)  Completed  .  Hepatitis C: One time screening is recommended by Center for Disease Control  (CDC) for  adults born from [birth year] through 1965.   Completed   Next mammogram is recommended 09/2018 (yearly, not 2021 as stated above).\ Double check with Dr. Collene Mares if you are due now or until until 2022 for your colonoscopy (due to h/o polyps, it was likely originally recommended every 5 years, but guidelines changed).  Contact us prior to your next refill and let us know if you want [place] to change it to the 50mg  (so that you can split them if desired).  I recommend getting the new shingles vaccine (Shingrix). You will need to check with your insurance to see if it is covered, and if covered by Medicare Part D, you need to get from the pharmacy rather than  our office.  It is a series of 2 injections, spaced 2 months apart.

## 2018-02-20 NOTE — Telephone Encounter (Signed)
Spoke with patient and gave appointment info.   Monday February 25, 2018 at 8:50.   Patient notified

## 2018-02-21 LAB — CBC WITH DIFFERENTIAL/PLATELET
BASOS: 1 %
Basophils Absolute: 0 10*3/uL (ref 0.0–0.2)
EOS (ABSOLUTE): 0.1 10*3/uL (ref 0.0–0.4)
Eos: 2 %
Hematocrit: 42.9 % (ref 34.0–46.6)
Hemoglobin: 13.8 g/dL (ref 11.1–15.9)
IMMATURE GRANS (ABS): 0 10*3/uL (ref 0.0–0.1)
Immature Granulocytes: 0 %
LYMPHS ABS: 1.8 10*3/uL (ref 0.7–3.1)
LYMPHS: 40 %
MCH: 30.1 pg (ref 26.6–33.0)
MCHC: 32.2 g/dL (ref 31.5–35.7)
MCV: 94 fL (ref 79–97)
MONOS ABS: 0.4 10*3/uL (ref 0.1–0.9)
Monocytes: 8 %
NEUTROS ABS: 2.3 10*3/uL (ref 1.4–7.0)
Neutrophils: 49 %
PLATELETS: 229 10*3/uL (ref 150–450)
RBC: 4.59 x10E6/uL (ref 3.77–5.28)
RDW: 12.9 % (ref 12.3–15.4)
WBC: 4.6 10*3/uL (ref 3.4–10.8)

## 2018-02-21 LAB — COMPREHENSIVE METABOLIC PANEL
ALT: 14 IU/L (ref 0–32)
AST: 17 IU/L (ref 0–40)
Albumin/Globulin Ratio: 1.6 (ref 1.2–2.2)
Albumin: 4.2 g/dL (ref 3.6–4.8)
Alkaline Phosphatase: 52 IU/L (ref 39–117)
BUN/Creatinine Ratio: 22 (ref 12–28)
BUN: 21 mg/dL (ref 8–27)
Bilirubin Total: 0.5 mg/dL (ref 0.0–1.2)
CALCIUM: 9.5 mg/dL (ref 8.7–10.3)
CO2: 25 mmol/L (ref 20–29)
CREATININE: 0.97 mg/dL (ref 0.57–1.00)
Chloride: 101 mmol/L (ref 96–106)
GFR, EST AFRICAN AMERICAN: 70 mL/min/{1.73_m2} (ref >59)
GFR, EST NON AFRICAN AMERICAN: 61 mL/min/{1.73_m2} (ref >59)
GLUCOSE: 89 mg/dL (ref 65–99)
Globulin, Total: 2.7 g/dL (ref 1.5–4.5)
Potassium: 5.1 mmol/L (ref 3.5–5.2)
Sodium: 139 mmol/L (ref 134–144)
TOTAL PROTEIN: 6.9 g/dL (ref 6.0–8.5)

## 2018-02-21 LAB — LIPID PANEL
CHOL/HDL RATIO: 3.5 ratio (ref 0.0–4.4)
Cholesterol, Total: 197 mg/dL (ref 100–199)
HDL: 57 mg/dL (ref >39)
LDL Calculated: 119 mg/dL — ABNORMAL HIGH (ref 0–99)
TRIGLYCERIDES: 107 mg/dL (ref 0–149)
VLDL CHOLESTEROL CAL: 21 mg/dL (ref 5–40)

## 2018-02-21 LAB — TSH: TSH: 3.14 u[IU]/mL (ref 0.450–4.500)

## 2018-02-24 ENCOUNTER — Ambulatory Visit
Admission: RE | Admit: 2018-02-24 | Discharge: 2018-02-24 | Disposition: A | Discharge status: Home / Self Care | Payer: PPO | Source: Ambulatory Visit | Attending: Family Medicine | Admitting: Family Medicine

## 2018-02-24 DIAGNOSIS — N6310 Unspecified lump in the right breast, unspecified quadrant: Secondary | ICD-10-CM

## 2018-02-24 DIAGNOSIS — R922 Inconclusive mammogram: Secondary | ICD-10-CM | POA: Diagnosis not present

## 2018-02-24 DIAGNOSIS — N644 Mastodynia: Secondary | ICD-10-CM

## 2018-02-27 ENCOUNTER — Encounter: Payer: Self-pay | Admitting: Family Medicine

## 2018-04-24 ENCOUNTER — Other Ambulatory Visit: Payer: Self-pay | Admitting: Family Medicine

## 2018-04-24 DIAGNOSIS — E039 Hypothyroidism, unspecified: Secondary | ICD-10-CM

## 2018-05-13 DIAGNOSIS — Z86010 Personal history of colonic polyps: Secondary | ICD-10-CM | POA: Diagnosis not present

## 2018-05-13 DIAGNOSIS — Z1211 Encounter for screening for malignant neoplasm of colon: Secondary | ICD-10-CM | POA: Diagnosis not present

## 2018-05-13 DIAGNOSIS — K219 Gastro-esophageal reflux disease without esophagitis: Secondary | ICD-10-CM | POA: Diagnosis not present

## 2018-05-13 DIAGNOSIS — K220 Achalasia of cardia: Secondary | ICD-10-CM | POA: Diagnosis not present

## 2018-06-02 DIAGNOSIS — D122 Benign neoplasm of ascending colon: Secondary | ICD-10-CM | POA: Diagnosis not present

## 2018-06-02 DIAGNOSIS — D125 Benign neoplasm of sigmoid colon: Secondary | ICD-10-CM | POA: Diagnosis not present

## 2018-06-02 DIAGNOSIS — Z86010 Personal history of colonic polyps: Secondary | ICD-10-CM | POA: Diagnosis not present

## 2018-06-02 DIAGNOSIS — K635 Polyp of colon: Secondary | ICD-10-CM | POA: Diagnosis not present

## 2018-06-02 DIAGNOSIS — Z1211 Encounter for screening for malignant neoplasm of colon: Secondary | ICD-10-CM | POA: Diagnosis not present

## 2018-06-02 LAB — HM COLONOSCOPY

## 2018-06-05 ENCOUNTER — Encounter: Payer: Self-pay | Admitting: Family Medicine

## 2018-06-12 ENCOUNTER — Encounter: Payer: Self-pay | Admitting: *Deleted

## 2018-06-16 ENCOUNTER — Encounter: Payer: Self-pay | Admitting: Family Medicine

## 2018-06-19 ENCOUNTER — Other Ambulatory Visit: Payer: Self-pay | Admitting: Family Medicine

## 2018-06-19 DIAGNOSIS — F411 Generalized anxiety disorder: Secondary | ICD-10-CM

## 2018-06-20 ENCOUNTER — Encounter: Payer: Self-pay | Admitting: Family Medicine

## 2018-09-04 ENCOUNTER — Ambulatory Visit: Payer: PPO | Admitting: Family Medicine

## 2019-04-08 ENCOUNTER — Other Ambulatory Visit: Payer: Self-pay

## 2019-05-27 ENCOUNTER — Encounter: Payer: Self-pay | Admitting: *Deleted

## 2019-05-27 ENCOUNTER — Encounter: Payer: Self-pay | Admitting: Family Medicine

## 2019-06-12 IMAGING — US ULTRASOUND RIGHT BREAST LIMITED
1 series · 2 of 2 positions shown · non-contrast
Comparison: Previous exams including most recent bilateral
screening mammogram dated 09/11/2017

CLINICAL DATA: Ordering physician describes a tender lump at in the
RIGHT breast at the 10 o'clock axis. Patient cannot localize the
lump for today's exam.

EXAM:
DIGITAL DIAGNOSTIC RIGHT MAMMOGRAM WITH CAD AND TOMO
ULTRASOUND RIGHT BREAST

[Series 1: ultrasound right breast limited · 0.07mm/px · 2 of 2 slices shown]
[im 1/2]
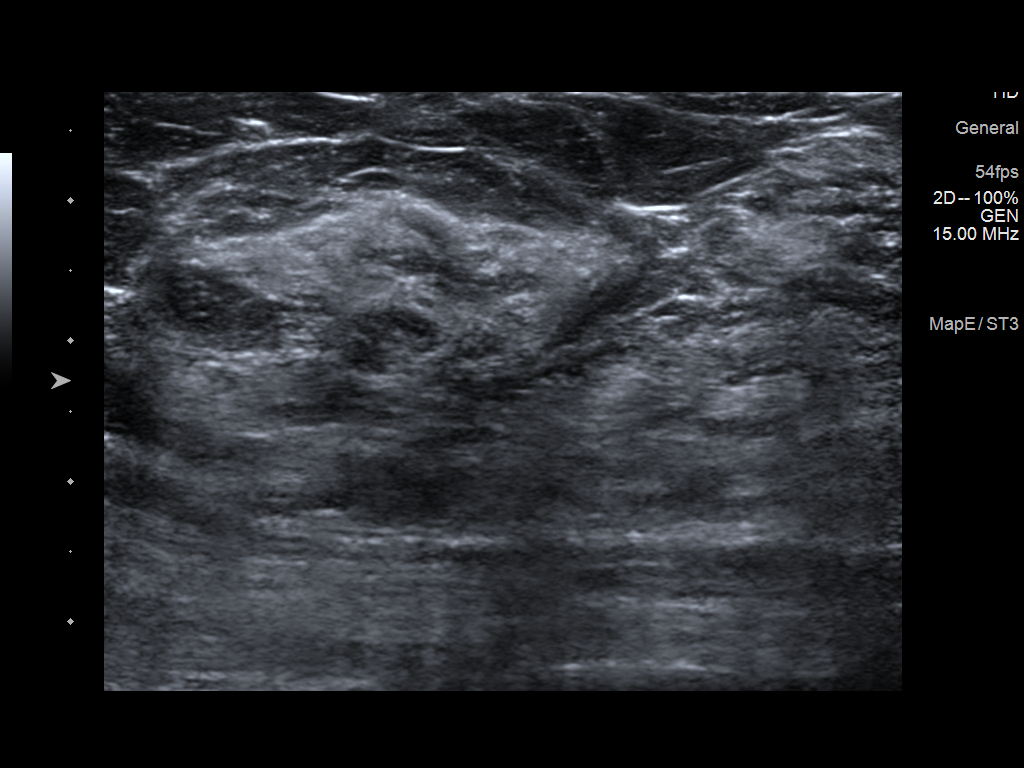
[im 2/2]
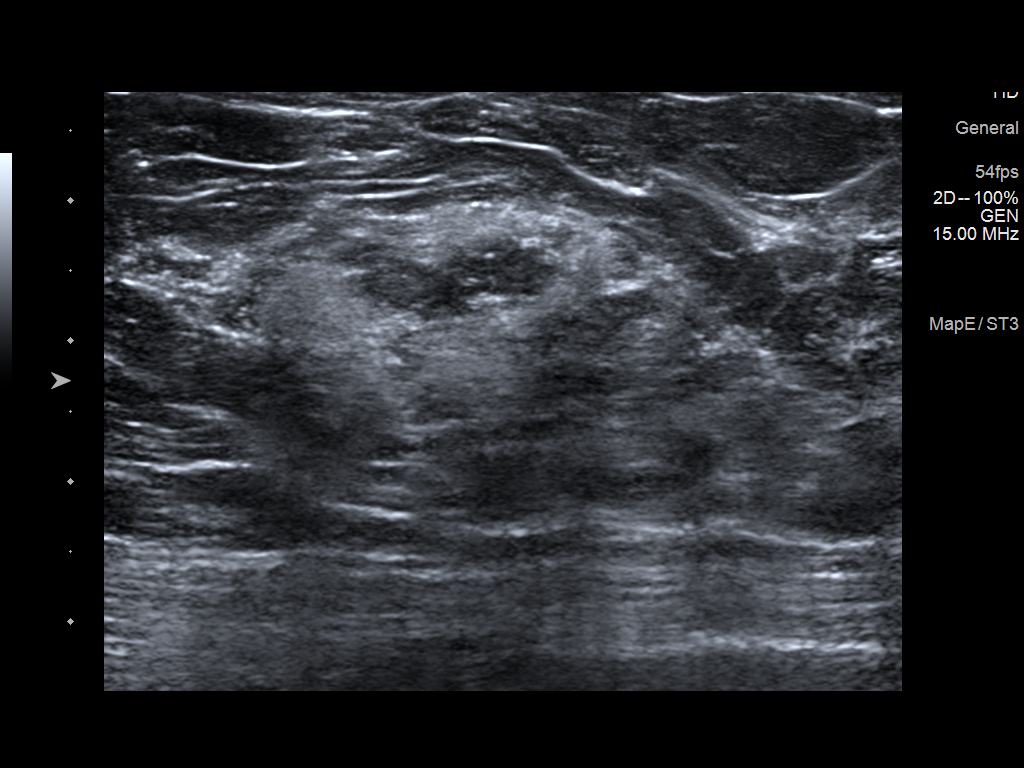

[2 of 2 positions shown; findings below may reference images not displayed]

ACR Breast Density Category c: The breast tissue is heterogeneously
dense, which may obscure small masses.
FINDINGS: There are no new dominant masses, suspicious calcifications or
secondary signs of malignancy identified in the RIGHT breast on
today's exam. Specifically, there is no mammographic abnormality
within the upper outer quadrant corresponding to the area of
clinical concern.

Mammographic images were processed with CAD.

Targeted ultrasound is performed, evaluating the upper-outer
quadrant of the RIGHT breast as directed by the ordering physician,
showing only normal dense fibroglandular tissues and fat lobules.
There is a ridge of normal dense fibroglandular tissue at the 10
o'clock axis, corresponding to the area of clinical concern.
IMPRESSION: No evidence of malignancy within the RIGHT breast. Ridge of normal
dense fibroglandular tissue within the RIGHT breast at the 10
o'clock axis, corresponding to the area of clinical concern.

RECOMMENDATION:
1. Annual screening mammograms. Next bilateral screening mammogram
will be due in Monday September, 2018, in conjunction with patient's
routine LEFT breast screening mammogram schedule.
2. The patient was instructed to return sooner if a new palpable
abnormality is identified in either breast.

I have discussed the findings and recommendations with the patient.
Results were also provided in writing at the conclusion of the
visit. If applicable, a reminder letter will be sent to the patient
regarding the next appointment.

BI-RADS CATEGORY  1: Negative.

## 2019-06-12 IMAGING — MG DIGITAL DIAGNOSTIC UNILATERAL RIGHT MAMMOGRAM WITH TOMO AND CAD
4 series · 4 of 12 positions shown · non-contrast
Comparison: Previous exams including most recent bilateral
screening mammogram dated 09/11/2017

CLINICAL DATA: Ordering physician describes a tender lump at in the
RIGHT breast at the 10 o'clock axis. Patient cannot localize the
lump for today's exam.

EXAM:
DIGITAL DIAGNOSTIC RIGHT MAMMOGRAM WITH CAD AND TOMO
ULTRASOUND RIGHT BREAST

[R MLO synth-2D]
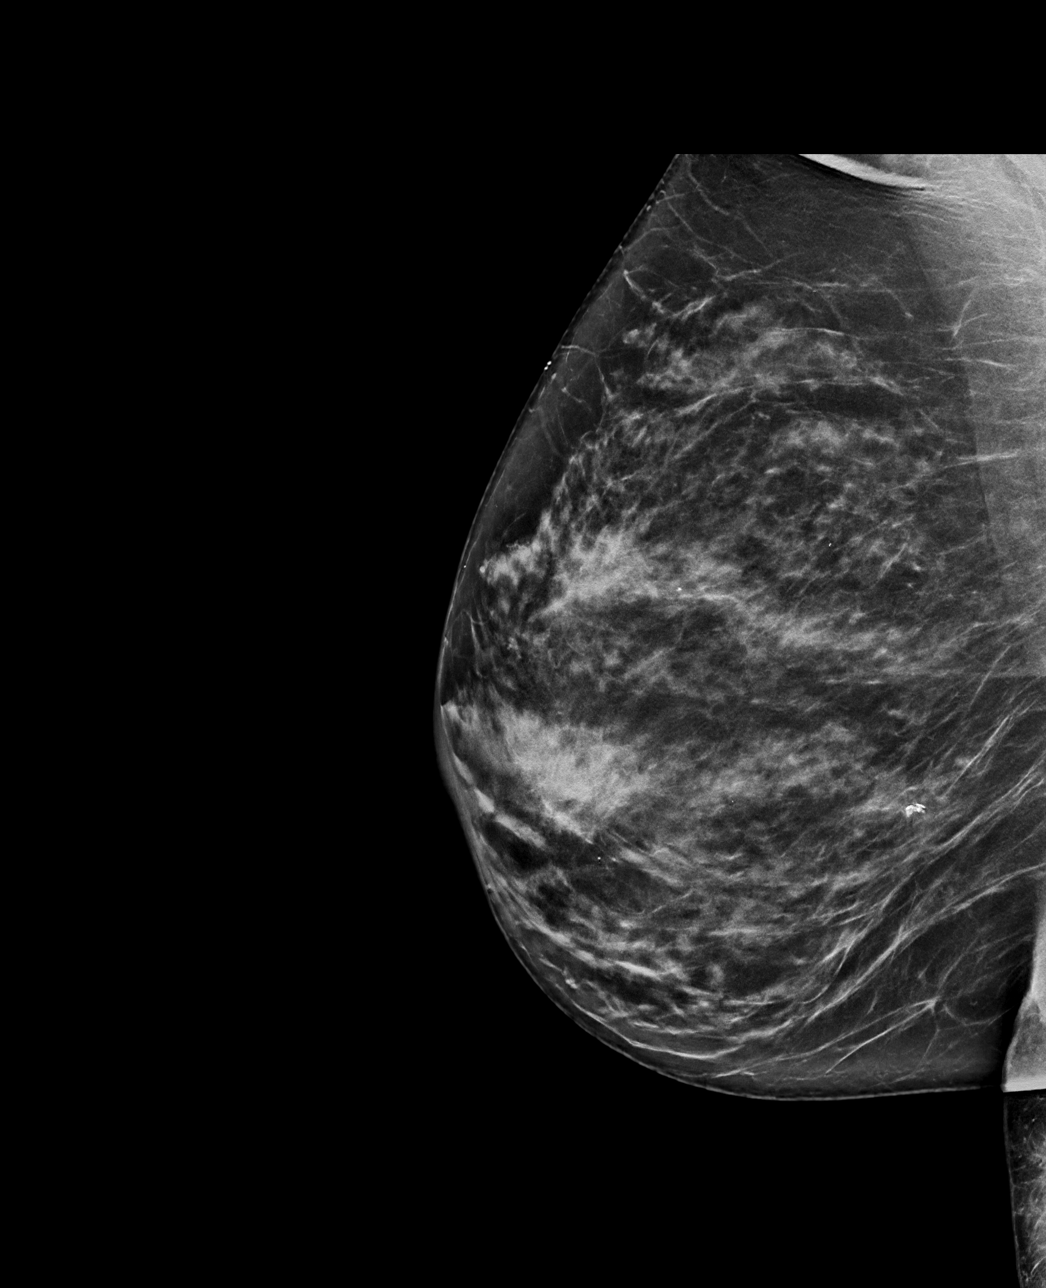

[R CC synth-2D]
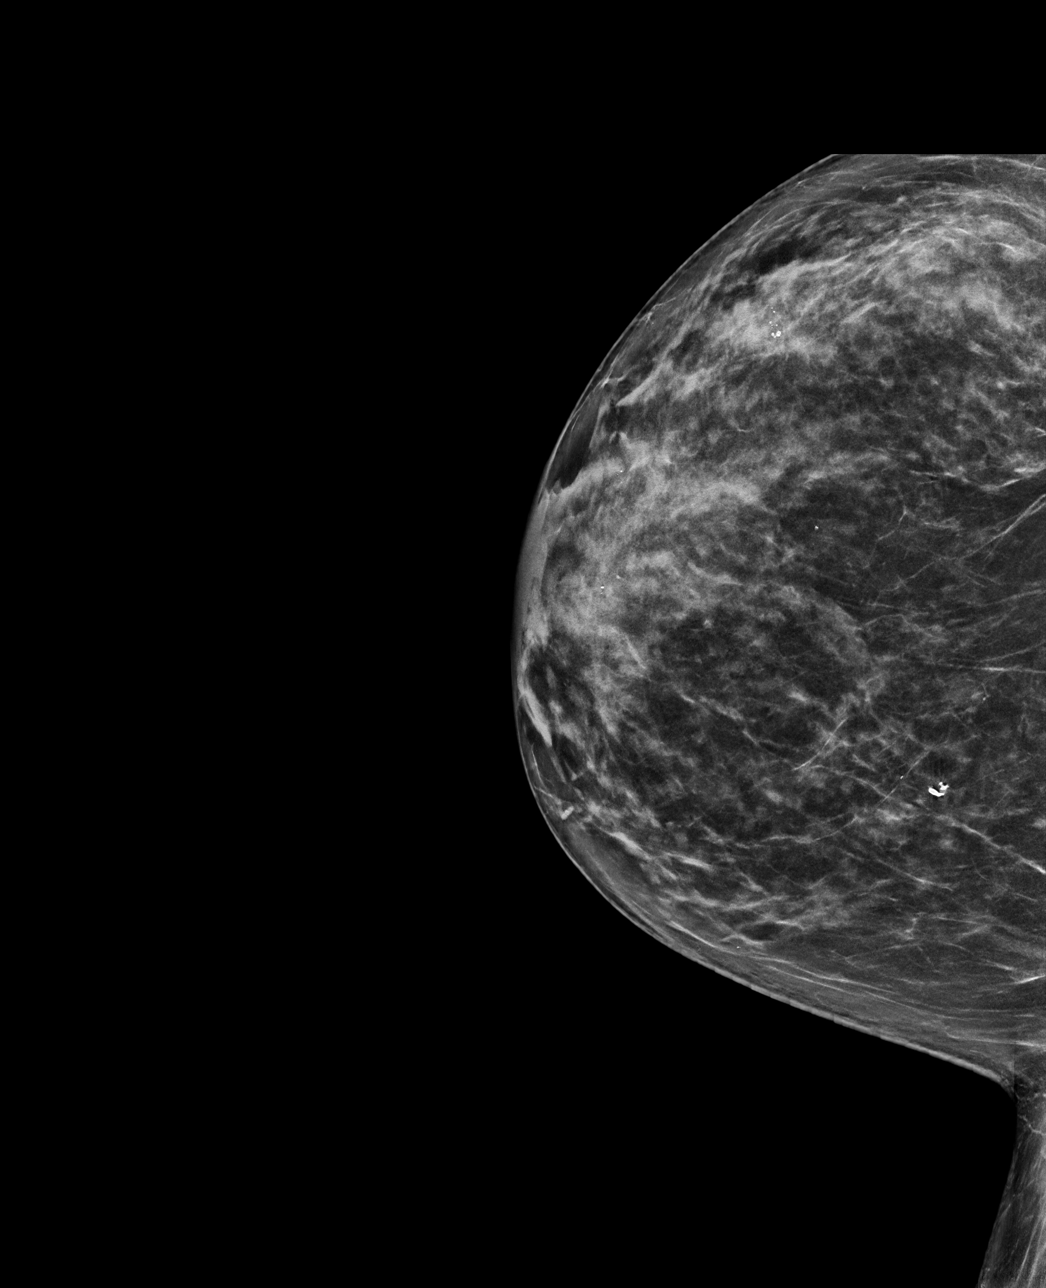

[R MLO tomo · tomo slice 43/86.0]
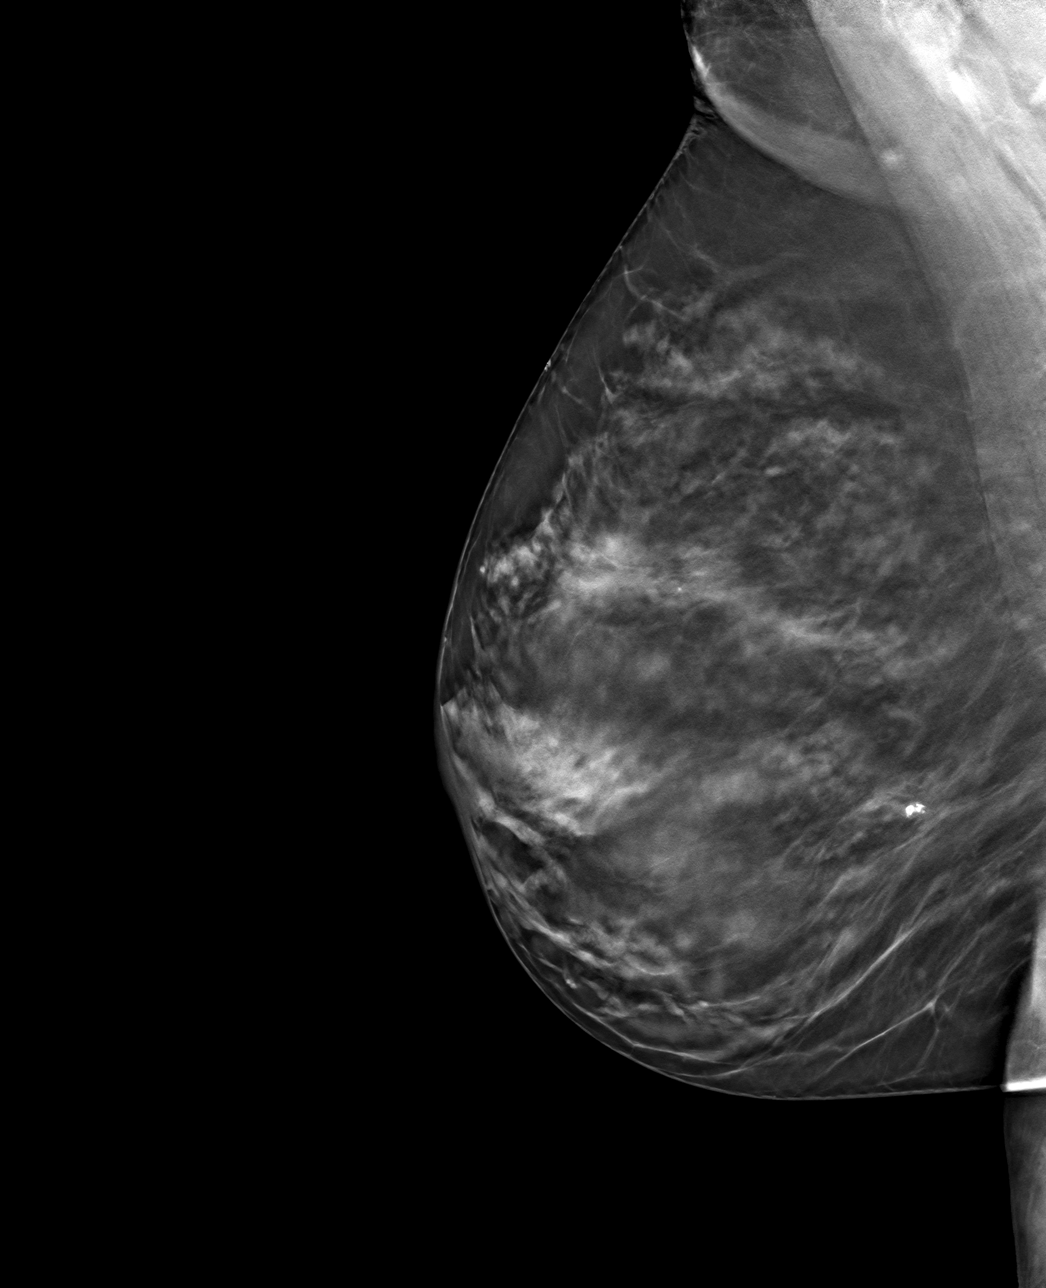

[R CC tomo · tomo slice 41/81.0]
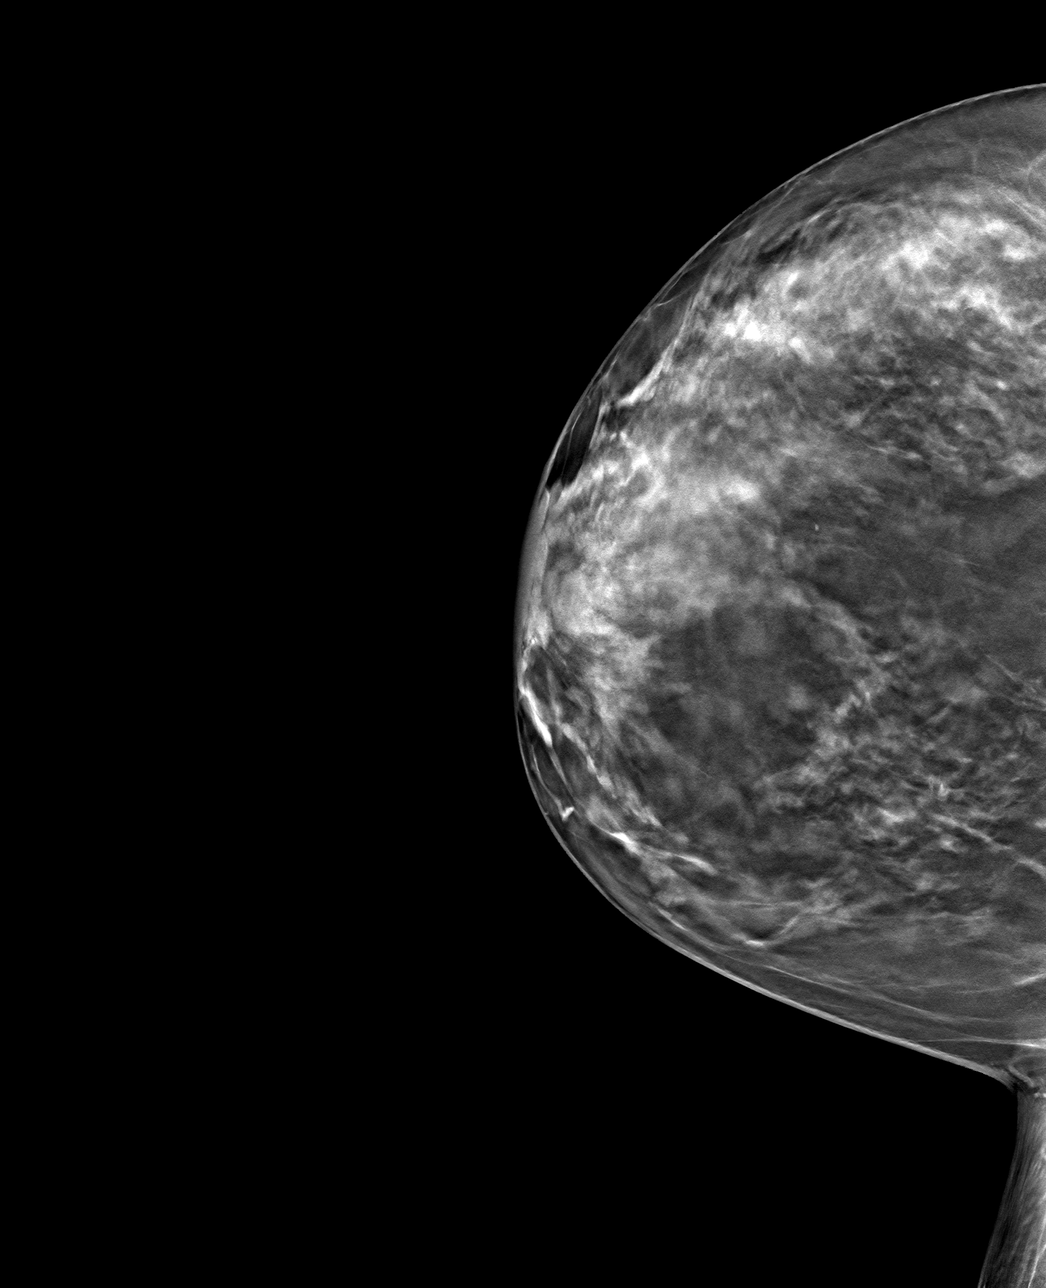

[4 of 12 positions shown; findings below may reference images not displayed]

ACR Breast Density Category c: The breast tissue is heterogeneously
dense, which may obscure small masses.
FINDINGS: There are no new dominant masses, suspicious calcifications or
secondary signs of malignancy identified in the RIGHT breast on
today's exam. Specifically, there is no mammographic abnormality
within the upper outer quadrant corresponding to the area of
clinical concern.

Mammographic images were processed with CAD.

Targeted ultrasound is performed, evaluating the upper-outer
quadrant of the RIGHT breast as directed by the ordering physician,
showing only normal dense fibroglandular tissues and fat lobules.
There is a ridge of normal dense fibroglandular tissue at the 10
o'clock axis, corresponding to the area of clinical concern.
IMPRESSION: No evidence of malignancy within the RIGHT breast. Ridge of normal
dense fibroglandular tissue within the RIGHT breast at the 10
o'clock axis, corresponding to the area of clinical concern.

RECOMMENDATION:
1. Annual screening mammograms. Next bilateral screening mammogram
will be due in Monday September, 2018, in conjunction with patient's
routine LEFT breast screening mammogram schedule.
2. The patient was instructed to return sooner if a new palpable
abnormality is identified in either breast.

I have discussed the findings and recommendations with the patient.
Results were also provided in writing at the conclusion of the
visit. If applicable, a reminder letter will be sent to the patient
regarding the next appointment.

BI-RADS CATEGORY  1: Negative.

## 2019-06-22 DIAGNOSIS — H5213 Myopia, bilateral: Secondary | ICD-10-CM | POA: Diagnosis not present

## 2019-06-22 DIAGNOSIS — H52203 Unspecified astigmatism, bilateral: Secondary | ICD-10-CM | POA: Diagnosis not present

## 2019-06-22 DIAGNOSIS — H2513 Age-related nuclear cataract, bilateral: Secondary | ICD-10-CM | POA: Diagnosis not present

## 2019-06-22 DIAGNOSIS — H524 Presbyopia: Secondary | ICD-10-CM | POA: Diagnosis not present

## 2019-08-24 ENCOUNTER — Other Ambulatory Visit: Payer: Self-pay | Admitting: Family Medicine

## 2019-08-24 ENCOUNTER — Other Ambulatory Visit: Payer: Self-pay | Admitting: *Deleted

## 2019-08-24 ENCOUNTER — Encounter: Payer: Self-pay | Admitting: Family Medicine

## 2019-08-24 DIAGNOSIS — F411 Generalized anxiety disorder: Secondary | ICD-10-CM

## 2019-08-24 DIAGNOSIS — E039 Hypothyroidism, unspecified: Secondary | ICD-10-CM

## 2019-08-24 DIAGNOSIS — Z5181 Encounter for therapeutic drug level monitoring: Secondary | ICD-10-CM

## 2019-08-24 DIAGNOSIS — E7800 Pure hypercholesterolemia, unspecified: Secondary | ICD-10-CM

## 2019-08-27 ENCOUNTER — Other Ambulatory Visit: Payer: Self-pay

## 2019-08-27 ENCOUNTER — Other Ambulatory Visit: Payer: PPO

## 2019-08-27 DIAGNOSIS — E7800 Pure hypercholesterolemia, unspecified: Secondary | ICD-10-CM | POA: Diagnosis not present

## 2019-08-27 DIAGNOSIS — E039 Hypothyroidism, unspecified: Secondary | ICD-10-CM

## 2019-08-27 DIAGNOSIS — Z5181 Encounter for therapeutic drug level monitoring: Secondary | ICD-10-CM | POA: Diagnosis not present

## 2019-08-28 LAB — COMPREHENSIVE METABOLIC PANEL
ALT: 9 IU/L (ref 0–32)
AST: 18 IU/L (ref 0–40)
Albumin/Globulin Ratio: 1.8 (ref 1.2–2.2)
Albumin: 4.4 g/dL (ref 3.8–4.8)
Alkaline Phosphatase: 56 IU/L (ref 39–117)
BUN/Creatinine Ratio: 24 (ref 12–28)
BUN: 21 mg/dL (ref 8–27)
Bilirubin Total: 0.4 mg/dL (ref 0.0–1.2)
CO2: 25 mmol/L (ref 20–29)
Calcium: 9.1 mg/dL (ref 8.7–10.3)
Chloride: 101 mmol/L (ref 96–106)
Creatinine, Ser: 0.89 mg/dL (ref 0.57–1.00)
GFR calc Af Amer: 77 mL/min/{1.73_m2} (ref >59)
GFR calc non Af Amer: 67 mL/min/{1.73_m2} (ref >59)
Globulin, Total: 2.5 g/dL (ref 1.5–4.5)
Glucose: 90 mg/dL (ref 65–99)
Potassium: 5 mmol/L (ref 3.5–5.2)
Sodium: 139 mmol/L (ref 134–144)
Total Protein: 6.9 g/dL (ref 6.0–8.5)

## 2019-08-28 LAB — LIPID PANEL
Chol/HDL Ratio: 3.8 ratio (ref 0.0–4.4)
Cholesterol, Total: 215 mg/dL — ABNORMAL HIGH (ref 100–199)
HDL: 57 mg/dL (ref >39)
LDL Chol Calc (NIH): 132 mg/dL — ABNORMAL HIGH (ref 0–99)
Triglycerides: 146 mg/dL (ref 0–149)
VLDL Cholesterol Cal: 26 mg/dL (ref 5–40)

## 2019-08-28 LAB — TSH: TSH: 2.64 u[IU]/mL (ref 0.450–4.500)

## 2019-08-29 NOTE — Progress Notes (Signed)
Start time: 1:45 End time: 2:45  Virtual Visit via Video Note  I connected with Olivia Cunningham on 08/31/2019 by a video enabled telemedicine application and verified that I am speaking with the correct person using two identifiers.  Location: Patient: home Provider: office   I discussed the limitations of evaluation and management by telemedicine and the availability of in person appointments. The patient expressed understanding and agreed to proceed.  History of Present Illness:  Chief Complaint  Patient presents with  . Medicare Wellness    VIRTUAL AWV/med check. Left shoulder pain that comes and goes-she will call ortho once COVID has resolved. She is taking more aleve currently.      Olivia Cunningham is a 68 y.o. female who presents for annual wellness visit and follow-up on chronic medical conditions. She is past due for a complete physical exam (hadn't felt comfortable coming to the office at due to COVID-19 pandemic.) She had labs done prior to today's visit. See below for results.   She has had left shoulder pain x 6 weeks. She has no known trauma/fall/injury.  She has been doing a lot of cleaning, housework, reaching overhead. Heating pad and advil/aleve has helped. Hurts to reach across the front of her body. Hurts to reach behind her to undo the bra  Hypothyroidism:She takes her thyroid medication when she first wakes up,not with food or other medications/vitamins.Takes zoloft and baby aspirin 60-90 minutes later, and CoQ10, turmeric and MVI at dinnertime.  She denies any symptoms--just mild hair loss that she relates to menopause. No skin/bowel changes, weight, energy or mood changes  Hyperlipidemia follow-up: Patient is reportedly following a low-fat, low cholesterol diet. Compliant withsimvastatin(40mg , half of an 80mg  tab)and denies medication side effects. She occasionally gets some R  leg pain at night, which she relates some to compression in one of her disks.  Takes coenzyme Q10 Diet has changed since COVID (more baking, cooking at home)--eating more red meat recently.(had brisket, irish stew recently).  Depression/Anxiety: She decreased zoloft dose back to 50mg  after she retired, hasn't noticed any increase in symptoms.  She worked the first quarter of the year, not since mid-March. Hasn't needed any alprazolam.   Immunization History  Administered Date(s) Administered  . Hepatitis A 11/06/2010  . Influenza, High Dose Seasonal PF 05/07/2016, 05/27/2019  . Influenza,inj,Quad PF,6+ Mos 09/21/2015  . Pneumococcal Conjugate-13 12/26/2016  . Pneumococcal Polysaccharide-23 02/20/2018  . Td 01/08/2006  . Tdap 12/09/2009  . Zoster 09/21/2015   (got [facility] HepA [facility] de[facility], 10/2010 given here)  Got flu shot at [facility] Last Pap smear:12/2016,normal, no high risk HPV Last mammogram:09/2017; she had mammo and [place] of R breast (only) in 02/2018.  Was due for bilat mammo 09/2018. Last colonoscopy: 05/2018 with Dr. Collene Mares.  +adenomatous polyps, repeat 5 years. Last DEXA:09/2017: T-1.4 L fem neck Dentist: twice yearly, changed to every 9 months Ophtho: yearly, recent Exercise:walks 40 minutes 4x/week, weather permitting.  Walks in her house while she is on the phone in her house. Sometimes uses weights at home, not recently.  Vitamin D-OH level 40 in 02/2013  Other doctors caring for patient include: GI: Dr. Collene Mares Dentist: Dr. Mariea Clonts ENT: Dr. Cresenciano Lick  ophtho: Dr. Gershon Crane Ortho: Prev saw Dr. Alphonzo Cruise and PA at [facility]  Depression screen:Negative Fall screen: Negative Functional Status screen: negative Mini-Cog screen: normal, normal clock drawing visualized over video See full screens in Epic  End of Life Discussion: Patient hasa living will and medical power of attorney  PMH, PSH, SH and FH were reviewed and updated.   Outpatient Encounter Medications as of 08/31/2019  Medication Sig Note  . aspirin 81 MG tablet Take 81 mg by mouth  daily.   . Black Pepper-Turmeric (TURMERIC COMPLEX/BLACK PEPPER PO) Take 1 capsule by mouth daily. 08/31/2019: Turmeric and curcumin only  . Coenzyme Q10 (CO Q-10) 300 MG CAPS Take by mouth.   . levothyroxine (SYNTHROID, LEVOTHROID) 112 MCG tablet TAKE ONE TABLET BY MOUTH DAILY   . Multiple Vitamin (MULTIVITAMIN) tablet Take 1 tablet by mouth daily.     . sertraline (ZOLOFT) 100 MG tablet TAKE ONE TABLET BY MOUTH DAILY (Patient taking differently: Take 50 mg by mouth daily. )   . simvastatin (ZOCOR) 80 MG tablet TAKE ONE TABLET BY MOUTH EVERY EVENING AT 6 PM. (Patient taking differently: Take 40 mg by mouth. Take 1/2 tablet daily by mouth)   . Naproxen Sodium (ALEVE PO) Take 220 mg by mouth as needed.    No facility-administered encounter medications on file as of 08/31/2019.   Allergies  Allergen Reactions  . Demerol Nausea And Vomiting  . Penicillins Rash    ROS: The patient denies anorexia, fever, weight changes, headaches, vision changes, decreased hearing, ear pain, sore throat, breast concerns, chest pain, palpitations, dizziness, syncope, dyspnea on exertion, cough, swelling, nausea, vomiting, diarrhea, constipation, abdominal pain, melena, hematochezia, hematuria, incontinence, dysuria, vaginal bleeding, discharge, odor or itch, genital lesions, numbness, tingling, weakness, tremor, suspicious skin lesions, depression, anxiety, abnormal bleeding/bruising, or enlarged lymph nodes. Occasional heartburn, no dysphagia. Occasional cough and sneezing, which she suspects is allergies. L shoulder pain per HPI. Only once had pain radiating down the arm. RLE discomfort at night, not every night.   PHYSICAL EXAM:  BP 122/70   Pulse 68   Ht 5\' 4"  (1.626 m)   Wt 151 lb (68.5 kg)   BMI 25.92 kg/m   Wt Readings from Last 3 Encounters:  02/20/18 153 lb 3.2 oz (69.5 kg)  12/26/16 148 lb 9.6 oz (67.4 kg)  05/07/16 150 lb 12.8 oz (68.4 kg)   She is alert and oriented, in good  spirits. Cranial nerves are grossly intact.  Normal eye contact, speech. Normal mood, affect, grooming. Exam is limited due to virtual nature of the visit. Good range of motion of left shoulder, discomfort when reaching across front of body  Lab Results  Component Value Date   TSH 2.640 08/27/2019   Lab Results  Component Value Date   CHOL 215 (H) 08/27/2019   HDL 57 08/27/2019   LDLCALC 132 (H) 08/27/2019   TRIG 146 08/27/2019   CHOLHDL 3.8 08/27/2019     Chemistry      Component Value Date/Time   NA 139 08/27/2019 0832   K 5.0 08/27/2019 0832   CL 101 08/27/2019 0832   CO2 25 08/27/2019 0832   BUN 21 08/27/2019 0832   CREATININE 0.89 08/27/2019 0832   CREATININE 0.93 12/26/2016 0826      Component Value Date/Time   CALCIUM 9.1 08/27/2019 0832   ALKPHOS 56 08/27/2019 0832   AST 18 08/27/2019 0832   ALT 9 08/27/2019 0832   BILITOT 0.4 08/27/2019 0832     Fasting glucose 90   ASSESSMENT/PLAN:  Medicare annual wellness visit, subsequent  Hypothyroidism, unspecified type - adequately replaced, continue 112 mcg dose - Plan: levothyroxine (SYNTHROID) 112 MCG tablet  Hypercholesteremia - LDL higher than previously, due to recent intake of red meat. Cut back on red meat and cont 40mg   simvastatin - Plan: simvastatin (ZOCOR) 40 MG tablet  Major depression in remission ([place]) - depression/anxiety still very well controlled.  Change to 50mg  tablet, and discussed poss further tapering of medication in future (spring/summer)  Anxiety state - depression/anxiety still very well controlled.  Change to 50mg  tablet, and discussed poss further tapering of medication in future (spring/summer) - Plan: sertraline (ZOLOFT) 50 MG tablet  Acute pain of left shoulder - based on history, suspect tendinitis. Shown ROM exercises. f/u with ortho if persistent/worsening pain   Discussed monthly self breast exams and yearly mammograms (past due, reminded to schedule); at least 30 minutes of  aerobic activity at least 5 days/week, weight bearing exercise at least 2x/wk; proper sunscreen use reviewed; healthy diet, including goals of calcium and vitamin D intake and alcohol recommendations (less than or equal to 1 drink/day) reviewed; regular seatbelt use; changing batteries in smoke detectors. Immunization recommendations discussed--continueyearly flu shots. Recommended Shingrix, to get from pharmacy. Risks/benefits reviewed. Tdap will be due in April 2021 (to get at [facility]). Colonoscopy recommendations reviewed, UTD (due again 05/2023).  MOST form reviewed (unable to sign due to virtual nature of the visit). Full Code, Full Care.  F/u for in-office CPE (now feels comfortable coming--next available visit, can also put on cancellation list)   Medicare Attestation I have personally reviewed: The patient's medical and social history Their use of alcohol, tobacco or illicit drugs Their current medications and supplements The patient's functional ability including ADLs,fall risks, home safety risks, cognitive, and hearing and visual impairment Diet and physical activities Evidence for depression or mood disorders   Follow Up Instructions:  Advised of how to access AVS through MyChart.  I discussed the assessment and treatment plan with the patient. The patient was provided an opportunity to ask questions and all were answered. The patient agreed with the plan and demonstrated an understanding of the instructions.   The patient was advised to call back or seek an in-person evaluation if the symptoms worsen or if the condition fails to improve as anticipated.  I provided 60 minutes of non-face-to-face time during this encounter.  Vikki Ports, MD

## 2019-08-29 NOTE — Patient Instructions (Addendum)
HEALTH MAINTENANCE RECOMMENDATIONS:  It is recommended that you get at least 30 minutes of aerobic exercise at least 5 days/week (for weight loss, you may need as much as 60-90 minutes). This can be any activity that gets your heart rate up. This can be divided in 10-15 minute intervals if needed, but try and build up your endurance at least once a week.  Weight bearing exercise is also recommended twice weekly.  Eat a healthy diet with lots of vegetables, fruits and fiber.  "Colorful" foods have a lot of vitamins (ie green vegetables, tomatoes, red peppers, etc).  Limit sweet tea, regular sodas and alcoholic beverages, all of which has a lot of calories and sugar.  Up to 1 alcoholic drink daily may be beneficial for women (unless trying to lose weight, watch sugars).  Drink a lot of water.  Calcium recommendations are 1200-1500 mg daily (1500 mg for postmenopausal women or women without ovaries), and vitamin D 1000 IU daily.  This should be obtained from diet and/or supplements (vitamins), and calcium should not be taken all at once, but in divided doses.  Monthly self breast exams and yearly mammograms for women over the age of 56 is recommended.  Sunscreen of at least SPF 30 should be used on all sun-exposed parts of the skin when outside between the hours of 10 am and 4 pm (not just when at beach or pool, but even with exercise, golf, tennis, and yard work!)  Use a sunscreen that says "broad spectrum" so it covers both UVA and UVB rays, and make sure to reapply every 1-2 hours.  Remember to change the batteries in your smoke detectors when changing your clock times in the spring and fall. Carbon monoxide detectors are recommended for your home.  Use your seat belt every time you are in a car, and please drive safely and not be distracted with cell phones and texting while driving.   Ms. Boward , Thank you for taking time to come for your Medicare Wellness Visit. I appreciate your ongoing  commitment to your health goals. Please review the following plan we discussed and let me know if I can assist you in the future.   This is a list of the screening recommended for you and due dates:  Health Maintenance  Topic Date Due  . Mammogram  09/11/2018  . Tetanus Vaccine  12/10/2019  . Colon Cancer Screening  06/03/2023  . Flu Shot  Completed  . DEXA scan (bone density measurement)  Completed  .  Hepatitis C: One time screening is recommended by Center for Disease Control  (CDC) for  adults born from [birth year] through 1965.   Completed  . Pneumonia vaccines  Completed   You are past due for bilateral screening mammogram.  Please schedule this. You will be due for your tetanus shot in April--this is covered by Medicare part D and you need to get this from the pharmacy, not our office.  I recommend getting the new shingles vaccine (Shingrix). You will need to get this from the pharmacy, as it is covered by Medicare part D.  It is a series of 2 injections, spaced 2 months apart.  COVID-19 vaccine is recommended when available to you.  At your convenience, please get [place] a copy of your living will and healthcare power of attorney so it can be scanned into your chart.   Shoulder Range of Motion Exercises Shoulder range of motion (ROM) exercises are done to keep the shoulder moving  freely or to increase movement. They are often recommended for people who have shoulder pain or stiffness or who are recovering from a shoulder surgery. Phase 1 exercises When you are able, do this exercise 1-2 times per day for 30-60 seconds in each direction, or as directed by your health care provider. Pendulum exercise To do this exercise while sitting: 1. Sit in a chair or at the edge of your bed with your feet flat on the floor. 2. Let your affected arm hang down in front of you over the edge of the bed or chair. 3. Relax your shoulder, arm, and hand. [place] your body so your arm gently swings in small  circles. You can also use your unaffected arm to start the motion. 5. Repeat changing the direction of the circles, swinging your arm left and right, and swinging your arm forward and back. To do this exercise while standing: 1. Stand next to a sturdy chair or table, and hold on to it with your hand on your unaffected side. 2. Bend forward at the waist. 3. Bend your knees slightly. 4. Relax your shoulder, arm, and hand. 5. While keeping your shoulder relaxed, use body motion to swing your arm in small circles. 6. Repeat changing the direction of the circles, swinging your arm left and right, and swinging your arm forward and back. 7. Between exercises, stand up tall and take a short break to relax your lower back.  Phase 2 exercises Do these exercises 1-2 times per day or as told by your health care provider. Hold each stretch for 30 seconds, and repeat 3 times. Do the exercises with one or both arms as instructed by your health care provider. For these exercises, sit at a table with your hand and arm supported by the table. A chair that slides easily or has wheels can be helpful. External rotation 1. Turn your chair so that your affected side is nearest to the table. 2. Place your forearm on the table to your side. Bend your elbow about 90 at the elbow (right angle) and place your hand palm facing down on the table. Your elbow should be about 6 inches away from your side. 3. Keeping your arm on the table, lean your body forward. Abduction 1. Turn your chair so that your affected side is nearest to the table. 2. Place your forearm and hand on the table so that your thumb points toward the ceiling and your arm is straight out to your side. 3. Slide your hand out to the side and away from you, using your unaffected arm to do the work. 4. To increase the stretch, you can slide your chair away from the table. Flexion: forward stretch 1. Sit facing the table. Place your hand and elbow on the  table in front of you. 2. Slide your hand forward and away from you, using your unaffected arm to do the work. 3. To increase the stretch, you can slide your chair backward. Phase 3 exercises Do these exercises 1-2 times per day or as told by your health care provider. Hold each stretch for 30 seconds, and repeat 3 times. Do the exercises with one or both arms as instructed by your health care provider. Cross-body stretch: posterior capsule stretch 1. Lift your arm straight out in front of you. 2. Bend your arm 90 at the elbow (right angle) so your forearm moves across your body. 3. Use your other arm to gently pull the elbow across your body,  toward your other shoulder. Wall climbs 1. Stand with your affected arm extended out to the side with your hand resting on a door frame. 2. Slide your hand slowly up the door frame. 3. To increase the stretch, step through the door frame. Keep your body upright and do not lean. Wand exercises You will need a cane, a piece of PVC pipe, or a sturdy wooden dowel for wand exercises. Flexion To do this exercise while standing: 1. Hold the wand with both of your hands, palms down. 2. Using the other arm to help, lift your arms up and over your head, if able. 3. Push upward with your other arm to gently increase the stretch. To do this exercise while lying down: 1. Lie on your back with your elbows resting on the floor and the wand in both your hands. Your hands will be palm down, or pointing toward your feet. 2. Lift your hands toward the ceiling, using your unaffected arm to help if needed. 3. Bring your arms overhead as able, using your unaffected arm to help if needed. Internal rotation 1. Stand while holding the wand behind you with both hands. Your unaffected arm should be extended above your head with the arm of the affected side extended behind you at the level of your waist. The wand should be pointing straight up and down as you hold it. 2. Slowly  pull the wand up behind your back by straightening the elbow of your unaffected arm and bending the elbow of your affected arm. External rotation 1. Lie on your back with your affected upper arm supported on a small pillow or rolled towel. When you first do this exercise, keep your upper arm close to your body. Over time, bring your arm up to a 90 angle out to the side. 2. Hold the wand across your stomach and with both hands palm up. Your elbow on your affected side should be bent at a 90 angle. 3. Use your unaffected side to help push your forearm away from you and toward the floor. Keep your elbow on your affected side bent at a 90 angle. Contact a health care provider if you have:  New or increasing pain.  New numbness, tingling, weakness, or discoloration in your arm or hand. This information is not intended to replace advice given to you by your health care provider. Make sure you discuss any questions you have with your health care provider. Document Released: 05/26/2003 Document Revised: 10/09/2017 Document Reviewed: 10/09/2017 Elsevier Patient Education  2020 [employer].

## 2019-08-31 ENCOUNTER — Encounter: Payer: Self-pay | Admitting: Family Medicine

## 2019-08-31 ENCOUNTER — Ambulatory Visit (INDEPENDENT_AMBULATORY_CARE_PROVIDER_SITE_OTHER): Payer: PPO | Admitting: Family Medicine

## 2019-08-31 ENCOUNTER — Other Ambulatory Visit: Payer: Self-pay

## 2019-08-31 VITALS — BP 122/70 | HR 68 | Ht 64.0 in | Wt 151.0 lb

## 2019-08-31 DIAGNOSIS — E039 Hypothyroidism, unspecified: Secondary | ICD-10-CM | POA: Diagnosis not present

## 2019-08-31 DIAGNOSIS — Z0000 Encounter for general adult medical examination without abnormal findings: Secondary | ICD-10-CM | POA: Diagnosis not present

## 2019-08-31 DIAGNOSIS — M25512 Pain in left shoulder: Secondary | ICD-10-CM

## 2019-08-31 DIAGNOSIS — F411 Generalized anxiety disorder: Secondary | ICD-10-CM | POA: Diagnosis not present

## 2019-08-31 DIAGNOSIS — F325 Major depressive disorder, single episode, in full remission: Secondary | ICD-10-CM

## 2019-08-31 DIAGNOSIS — E7800 Pure hypercholesterolemia, unspecified: Secondary | ICD-10-CM | POA: Diagnosis not present

## 2019-08-31 MED ORDER — SIMVASTATIN 40 MG PO TABS: 40.0000 mg | ORAL_TABLET | Freq: Every day | ORAL | 3 refills | Status: DC

## 2019-08-31 MED ORDER — LEVOTHYROXINE SODIUM 112 MCG PO TABS: 112.0000 ug | ORAL_TABLET | Freq: Every day | ORAL | 3 refills | Status: DC

## 2019-08-31 MED ORDER — SERTRALINE HCL 50 MG PO TABS: 50.0000 mg | ORAL_TABLET | Freq: Every day | ORAL | 3 refills | Status: DC

## 2019-09-01 NOTE — Progress Notes (Signed)
Done pt is coming in Feb the 4th and I also put her on the list

## 2019-10-06 ENCOUNTER — Ambulatory Visit: Payer: PPO

## 2019-10-14 NOTE — Progress Notes (Signed)
Chief Complaint  Patient presents with  . Annual Exam    nonfasting annual exam with breast exam and pelvic. No concerns.     Olivia Cunningham is a 69 y.o. female who presents for a complete physical.  She had AWV in 08/2019.  BP noted to be elevated today. Denies h/o elevated BP's. She has a BP cuff at home, doesn't check often because BP hasn't been an issue. Doesn't salt foods except for avocados. +feta and olives on salads. She continues to get regular exercise.  BP Readings from Last 3 Encounters:  10/15/19 (!) 146/90  08/31/19 122/70  02/20/18 110/70   L shoulder pain reported at her December visit (at that time was having pain x 6 weeks). She has no known trauma/fall/injury.  She had been doing a lot of cleaning, housework, reaching overhead. Hurts to sleep on that side, hurts with certain movements, had to make adjustments in how she gets dressed, undressed, needs to clasp bra in the front and twist, as it hurts to reach behind her. She has been keeping the shoulder moving, doing some exercises, but still bothers her periodically (especially when sleeping at night). Currently denies any pain.  Denies weakness.  She has noticed some more heartburn recently, only occurring at night. Denies changes to her diet.  Denies any dysphagia, or recent issues with achalasia.  She recalls in the past having her HOB elevated on cinder blocks.  Hyperlipidemia follow-up: lipids were noted to be a little higher than previously, on last check in December.  She had been baking more, and had eaten more red meat prior to last check (brisket, irish stew).  She hasn't had any red meat since then. She continues to take 40mg  of simvastatin daily, without side effects, and takes coenzyme Q10. Occasionally has some R leg pain periodically at night which she thinks may be related to her back, related to change in sleep position related to her shoulder. (not related to statin). Denies RLE discomfort during the  day or with activity/walking. Denies back pain, sometimes feels a little stiff/sore after lugging vacuum up stairs. Lab Results  Component Value Date   CHOL 215 (H) 08/27/2019   HDL 57 08/27/2019   LDLCALC 132 (H) 08/27/2019   TRIG 146 08/27/2019   CHOLHDL 3.8 08/27/2019   Depression/Anxiety: Shedecreasedzoloft dose back to 50mg  after she retired, hasn't noticed any increase in symptoms. She worked the first quarter of the year, not since mid-March. Hasn't needed any alprazolam. Moods are good.  At her December visit we briefly discussed poss further tapering of medication in future (spring/summer).  She reports today that she has cut back some--taking 1/2 tablet 2x/week, full tablet 5x/week, and hasn't noticed any change.   Hypothyroidism:She takes her thyroid medication when she first wakes up,not with food or other medications/vitamins.Takes zoloft and baby aspirin 60-90 minutes later, and CoQ10, turmeric and MVI at dinnertime.  She denies any symptoms--just mild hair loss that she relates to menopause. No skin/bowel changes, weight, energy or mood changes Lab Results  Component Value Date   TSH 2.640 08/27/2019     Immunization History  Administered Date(s) Administered  . Hepatitis A 11/06/2010  . Influenza, High Dose Seasonal PF 05/07/2016, 05/27/2019  . Influenza,inj,Quad PF,6+ Mos 09/21/2015  . Influenza-Unspecified 04/10/2018  . Pneumococcal Conjugate-13 12/26/2016  . Pneumococcal Polysaccharide-23 02/20/2018  . Td 01/08/2006  . Tdap 12/09/2009  . Zoster 09/21/2015   Scheduled for first COVID vaccine today.  ([facility], 10/2010 given here) Last Pap smear:12/2016,normal, no high risk HPV Last mammogram:09/2017; she had mammo and [place] of R breast (only) in 02/2018.  [place] looked at the concerning part of exam (R breast ,10 o'clock position), found dense fibroglandular tissue there. Was due for bilat mammo 09/2018. Last colonoscopy: 05/2018 with Dr.  Collene Mares.  +adenomatous polyps, repeat 5 years. Last DEXA:09/2017: T-1.4 L fem neck Dentist: twice yearly, changed to every 9 months Ophtho: yearly, recent Exercise:walks40 minutes 4x/week, weather permitting (and will walk in her house while on the phone other days). Sometimes uses weights at home.  Vitamin D-OH level 40 in 02/2013   PMH, PSH, SH and FH reviewed, updated  Outpatient Encounter Medications as of 10/15/2019  Medication Sig Note  . aspirin 81 MG tablet Take 81 mg by mouth daily.   . Black Pepper-Turmeric (TURMERIC COMPLEX/BLACK PEPPER PO) Take 1 capsule by mouth daily. 08/31/2019: Turmeric and curcumin only  . Coenzyme Q10 (CO Q-10) 300 MG CAPS Take by mouth.   . levothyroxine (SYNTHROID) 112 MCG tablet Take 1 tablet (112 mcg total) by mouth daily.   . Multiple Vitamin (MULTIVITAMIN) tablet Take 1 tablet by mouth daily.     . sertraline (ZOLOFT) 50 MG tablet Take 1 tablet (50 mg total) by mouth daily.   . simvastatin (ZOCOR) 40 MG tablet Take 1 tablet (40 mg total) by mouth daily.   . Naproxen Sodium (ALEVE PO) Take 220 mg by mouth as needed.    No facility-administered encounter medications on file as of 10/15/2019.   Allergies  Allergen Reactions  . Demerol Nausea And Vomiting  . Penicillins Rash    ROS: The patient denies anorexia, fever, weight changes, headaches, vision changes, decreased hearing, ear pain, sore throat, breast concerns, chest pain, palpitations, dizziness, syncope, dyspnea on exertion, cough, swelling, nausea, vomiting, diarrhea, constipation, abdominal pain, melena, hematochezia, hematuria, incontinence, dysuria, vaginal bleeding, discharge, odor or itch, genital lesions, numbness, tingling, weakness, tremor, suspicious skin lesions, depression, anxiety, abnormal bleeding/bruising, or enlarged lymph nodes. Occasional heartburn, no dysphagia.  Has had a little more heartburn recently, at night only. L shoulder pain per HPI.  RLE discomfort at night,  not every night, unchanged.   PHYSICAL EXAM:  BP (!) 146/90   Pulse 80   Temp (!) 96.9 F (36.1 C) (Other (Comment))   Ht 5\' 4"  (1.626 m)   Wt 152 lb 9.6 oz (69.2 kg)   BMI 26.19 kg/m   140/82 on repeat by MD   General Appearance:   Alert, cooperative, no distress, appears stated age  Head:   Normocephalic, without obvious abnormality, atraumatic  Eyes:   PERRL, conjunctiva/corneas clear, EOM's intact, fundi benign  Ears:   Normal TM's and external ear canals.  Nose:  Not examined ,wearing mask due to COVID-19 pandemic  Throat:  Not examined, wearing mask due to COVID-19 pandemic  Neck:  Supple, no lymphadenopathy; thyroid:not enlarged, no tenderness/nodules; no carotidbruit or JVD  Back:  Spine nontender, no curvature, ROM normal, no CVA tenderness  Lungs:   Clear to auscultation bilaterally without wheezes, rales or ronchi; respirations unlabored  Chest Wall:   No tenderness or deformity  Heart:   Regular rate and rhythm, S1 and S2 normal, no murmur, rub or gallop  Breast Exam:   No masses, nipple discharge or inversion. She has mild fibroglandular changes noted bilaterally in UOQ's. There is a more prominent fibroglandular (?) area in the right UOQ (10 o'clock), more focal, nontender (has been tender on prior  exams).  No axillary lymphadenopathy  Abdomen:   Soft, non-tender, nondistended, normoactive bowel sounds, no masses, no hepatosplenomegaly  Genitalia:   Normal external genitalia without lesions; atrophic changes noted. BUS and vagina normal; no cervical motion tenderness. No abnormal vaginal discharge. Uterus and adnexa not enlarged, nontender, no masses. Pap notperformed  Rectal:   Normal tone, no masses or tenderness; guaiac negative stool. Nontender external hemorrhoid  Extremities:  No clubbing, cyanosis or edema. See below for shoulder exam.  Pulses:  2+ and symmetric all extremities  Skin:  Skin color,  texture, turgor normal, no rashes or lesions  Lymph nodes:  Cervical, supraclavicular, and axillary nodes normal  Neurologic:  CNII-XII intact, normal strength, sensation and gait; reflexes 2+ and symmetric throughout  Psych: Normal mood, affect, hygiene and grooming  Left shoulder exam-- painful abduction and slight pain with FF.  Decreased ROM (by about 20 degree) in FF and abduction.  Severely limited internal rotation on the left. Normal strength of RC's (subscapularis caused the most discomfort, strength was okay though).   ASSESSMENT/PLAN:  Annual physical exam  Pure hypercholesterolemia  Major depression in remission ([place]) - Has started to slowly taper down zoloft, doing well  Hypercholesteremia - cont current meds and low cholesterol diet  Hypothyroidism, unspecified type - adequately replaced on current dose  Left shoulder pain, unspecified chronicity - check XR; starting to get some frozen shoulder. Cont ROM. Rec PT (prefers to wait due to COVID); may need cortisone injection and/or ortho eval - Plan: DG Shoulder Left  Fibrocystic changes of right breast - More pronounced and focal in R at 10'clock, prev imaged with [place] 02/2018. Past due for f/u, encouraged her to schedule mammo  Elevated BP without diagnosis of hypertension - prev normal; reviewed Low Na diet.  To monitor at home and send [place] readings in a month  Vaccine counseling - counseled re: risks/SE and timing for COVID vaccines, Shingrix and when Tdap is next due   L shoulder pain, with decreased PROM. xr L shoulder; PT (prefers to wait until 2nd COVID vaccine)  L breast mass, past due for recheck (benign on [place] 02/2018) Reviewed the safety precautions that [facility] has taken   Discussed monthly self breast exams and yearly mammograms (past due, reminded to schedule); at least 30 minutes of aerobic activity at least 5 days/week, weight bearing exercise at least 2x/wk; proper sunscreen use  reviewed; healthy diet, including goals of calcium and vitamin D intake and alcohol recommendations (less than or equal to 1 drink/day) reviewed; regular seatbelt use; changing batteries in smoke detectors. Immunization recommendations discussed--continueyearly flu shots. Counseled re:  COVID vaccination (gets first dose today). Recommended Shingrix, to get from pharmacy, 4 weeks after 2nd COVID vaccine.  Risks/benefits reviewed. Tdap will be due in April 2021 (to get [facility]). Colonoscopy recommendations reviewed, UTD (due again 05/2023).  F/u 1 year, sooner prn. (ie for injection of L shoulder, after xray, if not going back to ortho).

## 2019-10-14 NOTE — Patient Instructions (Addendum)
HEALTH MAINTENANCE RECOMMENDATIONS:  It is recommended that you get at least 30 minutes of aerobic exercise at least 5 days/week (for weight loss, you may need as much as 60-90 minutes). This can be any activity that gets your heart rate up. This can be divided in 10-15 minute intervals if needed, but try and build up your endurance at least once a week.  Weight bearing exercise is also recommended twice weekly.  Eat a healthy diet with lots of vegetables, fruits and fiber.  "Colorful" foods have a lot of vitamins (ie green vegetables, tomatoes, red peppers, etc).  Limit sweet tea, regular sodas and alcoholic beverages, all of which has a lot of calories and sugar.  Up to 1 alcoholic drink daily may be beneficial for women (unless trying to lose weight, watch sugars).  Drink a lot of water.  Calcium recommendations are 1200-1500 mg daily (1500 mg for postmenopausal women or women without ovaries), and vitamin D 1000 IU daily.  This should be obtained from diet and/or supplements (vitamins), and calcium should not be taken all at once, but in divided doses.  Monthly self breast exams and yearly mammograms for women over the age of 63 is recommended.  Sunscreen of at least SPF 30 should be used on all sun-exposed parts of the skin when outside between the hours of 10 am and 4 pm (not just when at beach or pool, but even with exercise, golf, tennis, and yard work!)  Use a sunscreen that says "broad spectrum" so it covers both UVA and UVB rays, and make sure to reapply every 1-2 hours.  Remember to change the batteries in your smoke detectors when changing your clock times in the spring and fall. Carbon monoxide detectors are recommended for your home.  Use your seat belt every time you are in a car, and please drive safely and not be distracted with cell phones and texting while driving.   This is a list of the screening recommended for you and due dates:      Health Maintenance  Topic  Date Due  . Mammogram  09/11/2018  . Tetanus Vaccine  12/10/2019  . Colon Cancer Screening  06/03/2023  . Flu Shot  Completed  . DEXA scan (bone density measurement)  Completed  .  Hepatitis C: One time screening is recommended by Center for Disease Control  (CDC) for  adults born from [birth year] through 1965.   Completed  . Pneumonia vaccines  Completed   You are past due for bilateral screening mammogram.  Please schedule this. You will be due for your tetanus shot in April--this is covered by Medicare part D and you need to get this from the pharmacy, not our office.  I recommend getting the new shingles vaccine (Shingrix). You will need to get this from the pharmacy, as it is covered by Medicare part D.  It is a series of 2 injections, spaced 2 months apart.  You want to wait 4 weeks after completing your COVID series before getting Shingrix.   At your convenience, please get [place] a copy of your notarized living will and healthcare power of attorney so it can be scanned into your chart.  You may want to put some blocks back under the feet at the head of the bed to help with your reflux at night.  Resume your core work (pilates), and stretch AFTER exercise, when muscles are warm.   At your convenience, go to [facility] [phone] or [place]) for  an x-ray of your left shoulder. Physical therapy is recommended.  When you feel comfortable, let us know which practice you would like to be referred to. Honestly, you truly may need to have a cortisone injection, especially if PT is too painful for you to tolerate. (either here, or with your orthopedist).  Please monitor your blood pressure periodically at home.  Record and send me a list in the next 4 weeks.  Please limit the sodium in your diet, and continue regular exercise.   DASH Eating Plan DASH stands for "Dietary Approaches to Stop Hypertension." The DASH eating plan is a healthy eating plan that has been shown to reduce high  blood pressure (hypertension). It may also reduce your risk for type 2 diabetes, heart disease, and stroke. The DASH eating plan may also help with weight loss. What are tips for following this plan?  General guidelines  Avoid eating more than 2,300 mg (milligrams) of salt (sodium) a day. If you have hypertension, you may need to reduce your sodium intake to 1,500 mg a day.  Limit alcohol intake to no more than 1 drink a day for nonpregnant women and 2 drinks a day for men. One drink equals 12 oz of beer, 5 oz of wine, or 1 oz of hard liquor.  Work with your health care provider to maintain a healthy body weight or to lose weight. Ask what an ideal weight is for you.  Get at least 30 minutes of exercise that causes your heart to beat faster (aerobic exercise) most days of the week. Activities may include walking, swimming, or biking.  Work with your health care provider or diet and nutrition specialist (dietitian) to adjust your eating plan to your individual calorie needs. Reading food labels   Check food labels for the amount of sodium per serving. Choose foods with less than 5 percent of the Daily Value of sodium. Generally, foods with less than 300 mg of sodium per serving fit into this eating plan.  To find whole grains, look for the word "whole" as the first word in the ingredient list. Shopping  Buy products labeled as "low-sodium" or "no salt added."  Buy fresh foods. Avoid canned foods and premade or frozen meals. Cooking  Avoid adding salt when cooking. Use salt-free seasonings or herbs instead of table salt or sea salt. Check with your health care provider or pharmacist before using salt substitutes.  Do not fry foods. Cook foods using healthy methods such as baking, boiling, grilling, and broiling instead.  Cook with heart-healthy oils, such as olive, canola, soybean, or sunflower oil. Meal planning  Eat a balanced diet that includes: ? 5 or more servings of fruits and  vegetables each day. At each meal, try to fill half of your plate with fruits and vegetables. ? Up to 6-8 servings of whole grains each day. ? Less than 6 oz of lean meat, poultry, or fish each day. A 3-oz serving of meat is about the same size as a deck of cards. One egg equals 1 oz. ? 2 servings of low-fat dairy each day. ? A serving of nuts, seeds, or beans 5 times each week. ? Heart-healthy fats. Healthy fats called Omega-3 fatty acids are found in foods such as flaxseeds and coldwater fish, like sardines, salmon, and mackerel.  Limit how much you eat of the following: ? Canned or prepackaged foods. ? Food that is high in trans fat, such as fried foods. ? Food that is high in  saturated fat, such as fatty meat. ? Sweets, desserts, sugary drinks, and other foods with added sugar. ? Full-fat dairy products.  Do not salt foods before eating.  Try to eat at least 2 vegetarian meals each week.  Eat more home-cooked food and less restaurant, buffet, and fast food.  When eating at a restaurant, ask that your food be prepared with less salt or no salt, if possible. What foods are recommended? The items listed may not be a complete list. Talk with your dietitian about what dietary choices are best for you. Grains Whole-grain or whole-wheat bread. Whole-grain or whole-wheat pasta. Brown rice. [place]. Bulgur. Whole-grain and low-sodium cereals. Pita bread. Low-fat, low-sodium crackers. Whole-wheat flour tortillas. Vegetables Fresh or frozen vegetables (raw, steamed, roasted, or grilled). Low-sodium or reduced-sodium tomato and vegetable juice. Low-sodium or reduced-sodium tomato sauce and tomato paste. Low-sodium or reduced-sodium canned vegetables. Fruits All fresh, dried, or frozen fruit. Canned fruit in natural juice (without added sugar). Meat and other protein foods Skinless chicken or [place]. Ground chicken or [place]. Pork with fat trimmed off. Fish and seafood. Egg whites. Dried  beans, peas, or lentils. Unsalted nuts, nut butters, and seeds. Unsalted canned beans. Lean cuts of beef with fat trimmed off. Low-sodium, lean deli meat. Dairy Low-fat (1%) or fat-free (skim) milk. Fat-free, low-fat, or reduced-fat cheeses. Nonfat, low-sodium ricotta or cottage cheese. Low-fat or nonfat yogurt. Low-fat, low-sodium cheese. Fats and oils Soft margarine without trans fats. Vegetable oil. Low-fat, reduced-fat, or light mayonnaise and salad dressings (reduced-sodium). Canola, safflower, olive, soybean, and sunflower oils. Avocado. Seasoning and other foods Herbs. Spices. Seasoning mixes without salt. Unsalted popcorn and pretzels. Fat-free sweets. What foods are not recommended? The items listed may not be a complete list. Talk with your dietitian about what dietary choices are best for you. Grains Baked goods made with fat, such as croissants, muffins, or some breads. Dry pasta or rice meal packs. Vegetables Creamed or fried vegetables. Vegetables in a cheese sauce. Regular canned vegetables (not low-sodium or reduced-sodium). Regular canned tomato sauce and paste (not low-sodium or reduced-sodium). Regular tomato and vegetable juice (not low-sodium or reduced-sodium). Angie Fava. Olives. Fruits Canned fruit in a light or heavy syrup. Fried fruit. Fruit in cream or butter sauce. Meat and other protein foods Fatty cuts of meat. Ribs. Fried meat. Berniece Salines. Sausage. Bologna and other processed lunch meats. Salami. Fatback. Hotdogs. Bratwurst. Salted nuts and seeds. Canned beans with added salt. Canned or smoked fish. Whole eggs or egg yolks. Chicken or [place] with skin. Dairy Whole or 2% milk, cream, and half-and-half. Whole or full-fat cream cheese. Whole-fat or sweetened yogurt. Full-fat cheese. Nondairy creamers. Whipped toppings. Processed cheese and cheese spreads. Fats and oils Butter. Stick margarine. Lard. Shortening. Ghee. Bacon fat. Tropical oils, such as coconut, palm kernel, or  palm oil. Seasoning and other foods Salted popcorn and pretzels. Onion salt, garlic salt, seasoned salt, table salt, and sea salt. Worcestershire sauce. Tartar sauce. Barbecue sauce. Teriyaki sauce. Soy sauce, including reduced-sodium. Steak sauce. Canned and packaged gravies. Fish sauce. Oyster sauce. Cocktail sauce. Horseradish that you find on the shelf. Ketchup. Mustard. Meat flavorings and tenderizers. Bouillon cubes. Hot sauce and Tabasco sauce. Premade or packaged marinades. Premade or packaged taco seasonings. Relishes. Regular salad dressings. Where to find more information:  National Heart, Lung, and [place]: https://wilson-eaton.com/  American Heart Association: www.heart.org Summary  The DASH eating plan is a healthy eating plan that has been shown to reduce high blood pressure (hypertension). It may also  reduce your risk for type 2 diabetes, heart disease, and stroke.  With the DASH eating plan, you should limit salt (sodium) intake to 2,300 mg a day. If you have hypertension, you may need to reduce your sodium intake to 1,500 mg a day.  When on the DASH eating plan, aim to eat more fresh fruits and vegetables, whole grains, lean proteins, low-fat dairy, and heart-healthy fats.  Work with your health care provider or diet and nutrition specialist (dietitian) to adjust your eating plan to your individual calorie needs. This information is not intended to replace advice given to you by your health care provider. Make sure you discuss any questions you have with your health care provider. Document Revised: 08/09/2017 Document Reviewed: 08/20/2016 Elsevier Patient Education  2020 [employer].

## 2019-10-15 ENCOUNTER — Ambulatory Visit: Payer: PPO | Attending: Internal Medicine

## 2019-10-15 ENCOUNTER — Other Ambulatory Visit: Payer: Self-pay | Admitting: Family Medicine

## 2019-10-15 ENCOUNTER — Ambulatory Visit (INDEPENDENT_AMBULATORY_CARE_PROVIDER_SITE_OTHER): Payer: PPO | Admitting: Family Medicine

## 2019-10-15 ENCOUNTER — Encounter: Payer: Self-pay | Admitting: Family Medicine

## 2019-10-15 ENCOUNTER — Other Ambulatory Visit: Payer: Self-pay

## 2019-10-15 VITALS — BP 146/90 | HR 80 | Temp 96.9°F | Ht 64.0 in | Wt 152.6 lb

## 2019-10-15 DIAGNOSIS — E039 Hypothyroidism, unspecified: Secondary | ICD-10-CM | POA: Diagnosis not present

## 2019-10-15 DIAGNOSIS — E7800 Pure hypercholesterolemia, unspecified: Secondary | ICD-10-CM | POA: Diagnosis not present

## 2019-10-15 DIAGNOSIS — M25512 Pain in left shoulder: Secondary | ICD-10-CM | POA: Diagnosis not present

## 2019-10-15 DIAGNOSIS — N6011 Diffuse cystic mastopathy of right breast: Secondary | ICD-10-CM

## 2019-10-15 DIAGNOSIS — R030 Elevated blood-pressure reading, without diagnosis of hypertension: Secondary | ICD-10-CM | POA: Diagnosis not present

## 2019-10-15 DIAGNOSIS — Z7189 Other specified counseling: Secondary | ICD-10-CM | POA: Diagnosis not present

## 2019-10-15 DIAGNOSIS — Z23 Encounter for immunization: Secondary | ICD-10-CM | POA: Insufficient documentation

## 2019-10-15 DIAGNOSIS — F325 Major depressive disorder, single episode, in full remission: Secondary | ICD-10-CM | POA: Diagnosis not present

## 2019-10-15 DIAGNOSIS — Z7185 Encounter for immunization safety counseling: Secondary | ICD-10-CM

## 2019-10-15 DIAGNOSIS — Z0000 Encounter for general adult medical examination without abnormal findings: Secondary | ICD-10-CM | POA: Diagnosis not present

## 2019-10-15 DIAGNOSIS — Z1231 Encounter for screening mammogram for malignant neoplasm of breast: Secondary | ICD-10-CM

## 2019-10-15 NOTE — Progress Notes (Signed)
   Covid-19 Vaccination Clinic  Name:  MAUDRY WALZ    MRN: [id number] DOB: [birth year]  10/15/2019  Ms. Dransfield was observed post Covid-19 immunization for 15 minutes without incidence. She was provided with Vaccine Information Sheet and instruction to access the V-Safe system.   Ms. Coffey was instructed to call 911 with any severe reactions post vaccine: Marland Kitchen Difficulty breathing  . Swelling of your face and throat  . A fast heartbeat  . A bad rash all over your body  . Dizziness and weakness    Immunizations Administered    Name Date Dose VIS Date Route   Pfizer COVID-19 Vaccine 10/15/2019  2:51 PM 0.3 mL 08/21/2019 Intramuscular   Manufacturer: [place]   Lot: [id number]   [place]: [id number]

## 2019-10-23 ENCOUNTER — Ambulatory Visit
Admission: RE | Admit: 2019-10-23 | Discharge: 2019-10-23 | Disposition: A | Discharge status: Home / Self Care | Payer: PPO | Source: Ambulatory Visit | Attending: Family Medicine | Admitting: Family Medicine

## 2019-10-23 ENCOUNTER — Ambulatory Visit: Payer: PPO

## 2019-10-23 ENCOUNTER — Other Ambulatory Visit: Payer: Self-pay

## 2019-10-23 DIAGNOSIS — Z1231 Encounter for screening mammogram for malignant neoplasm of breast: Secondary | ICD-10-CM

## 2019-10-26 ENCOUNTER — Other Ambulatory Visit: Payer: Self-pay | Admitting: Family Medicine

## 2019-10-26 DIAGNOSIS — N6489 Other specified disorders of breast: Secondary | ICD-10-CM

## 2019-10-29 ENCOUNTER — Encounter: Payer: Self-pay | Admitting: Family Medicine

## 2019-11-06 ENCOUNTER — Other Ambulatory Visit: Payer: Self-pay

## 2019-11-06 ENCOUNTER — Ambulatory Visit
Admission: RE | Admit: 2019-11-06 | Discharge: 2019-11-06 | Disposition: A | Discharge status: Home / Self Care | Payer: PPO | Source: Ambulatory Visit | Attending: Family Medicine | Admitting: Family Medicine

## 2019-11-06 DIAGNOSIS — N6489 Other specified disorders of breast: Secondary | ICD-10-CM

## 2019-11-06 DIAGNOSIS — R922 Inconclusive mammogram: Secondary | ICD-10-CM | POA: Diagnosis not present

## 2019-11-10 ENCOUNTER — Ambulatory Visit: Payer: PPO | Attending: Internal Medicine

## 2019-11-10 DIAGNOSIS — Z23 Encounter for immunization: Secondary | ICD-10-CM | POA: Insufficient documentation

## 2019-11-10 NOTE — Progress Notes (Signed)
   Covid-19 Vaccination Clinic  Name:  Olivia Cunningham    MRN: [id number] DOB: 07-Jun-1951  11/10/2019  Olivia Cunningham was observed post Covid-19 immunization for 15 minutes without incident. She was provided with Vaccine Information Sheet and instruction to access the V-Safe system.   Olivia Cunningham was instructed to call 911 with any severe reactions post vaccine: Marland Kitchen Difficulty breathing  . Swelling of face and throat  . A fast heartbeat  . A bad rash all over body  . Dizziness and weakness   Immunizations Administered    Name Date Dose VIS Date Route   Pfizer COVID-19 Vaccine 11/10/2019  8:51 AM 0.3 mL 08/21/2019 Intramuscular   Manufacturer: [place]   Lot: [id number]   [place]: [id number]

## 2020-06-20 ENCOUNTER — Encounter: Payer: Self-pay | Admitting: Family Medicine

## 2020-06-22 ENCOUNTER — Encounter: Payer: Self-pay | Admitting: Family Medicine

## 2020-07-15 DIAGNOSIS — H52203 Unspecified astigmatism, bilateral: Secondary | ICD-10-CM | POA: Diagnosis not present

## 2020-07-15 DIAGNOSIS — H524 Presbyopia: Secondary | ICD-10-CM | POA: Diagnosis not present

## 2020-07-15 DIAGNOSIS — H2513 Age-related nuclear cataract, bilateral: Secondary | ICD-10-CM | POA: Diagnosis not present

## 2020-07-15 DIAGNOSIS — H5213 Myopia, bilateral: Secondary | ICD-10-CM | POA: Diagnosis not present

## 2020-08-16 ENCOUNTER — Encounter: Payer: Self-pay | Admitting: Family Medicine

## 2020-10-12 ENCOUNTER — Other Ambulatory Visit: Payer: Self-pay | Admitting: Family Medicine

## 2020-10-12 DIAGNOSIS — E039 Hypothyroidism, unspecified: Secondary | ICD-10-CM

## 2020-10-12 DIAGNOSIS — F411 Generalized anxiety disorder: Secondary | ICD-10-CM

## 2020-10-12 NOTE — Telephone Encounter (Signed)
Called pt to find out if she had enough med to last until her appt. Pt advised she will be out by 10/21/20. Please advise if zoloft can be sent in to cover her until her appt. [place]

## 2020-10-23 NOTE — Progress Notes (Signed)
Chief Complaint  Patient presents with  . Medicare Wellness    Fasting AWV with pelvic. Will get Tdap at pharmacy. Has a cough on and off for years. Also told she snores. No other concerns.     Olivia Cunningham is a 70 y.o. female who presents for a complete physical and Medicare annual wellness visit.   Last year her BP was noted to be elevated, with no h/o HTN.  We discussed low sodium diet, and she continues to get regular exercise. BP's at home have been running 115-127/70's, hasn't checked recently.  Last year had noted more heartburn at night. This has improved, though had some last night from chips/salsa while watching the Superbowl.  Hyperlipidemia follow-up: She continues to take 53m of simvastatin daily, without side effects, and takes coenzyme Q10. Occasionally has some R leg pain periodically at night. Denies RLE discomfort during the day or with activity/walking. 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 She has taken some Aleve, which helps. Lab Results  Component Value Date   CHOL 215 (H) 08/27/2019   HDL 57 08/27/2019   LDLCALC 132 (H) 08/27/2019   TRIG 146 08/27/2019   CHOLHDL 3.8 08/27/2019   Depression/Anxiety: Shedecreasedzoloft dose back to 553mafter she retired, At her visit last year she had reported cutting back further (taking 1/2 tablet 2x/week, full tablet 5x/week). She had a flare of depression over Thanksgiving.  She took 5036maily, used the "tools" she knew.  Resolved after 3-4 weeks  Currently back to taking 1/2 tablet 2x/week, full the other days, and doing well.   Hypothyroidism:She takes her thyroid medication when she first wakes up,separate from food and other medications/vitamins.Takes zoloft and baby aspirin 60-90 minutes later, and CoQ10, turmeric and MVI at dinnertime.  She denies any symptoms--just mild hair loss that she relates to menopause. No skin/bowel changes, weight,  energy or mood changes (other than around Thanksgiving, now improved). Lab Results  Component Value Date   TSH 2.640 08/27/2019    Immunization History  Administered Date(s) Administered  . Hepatitis A 11/06/2010  . Influenza, High Dose Seasonal PF 05/07/2016, 05/27/2019, 06/20/2020  . Influenza,inj,Quad PF,6+ Mos 09/21/2015  . Influenza-Unspecified 04/10/2018  . PFIZER(Purple Top)SARS-COV-2 Vaccination 10/15/2019, 11/10/2019, 06/06/2020  . Pneumococcal Conjugate-13 12/26/2016  . Pneumococcal Polysaccharide-23 02/20/2018  . Td 01/08/2006  . Tdap 12/09/2009  . Zoster 09/21/2015  . Zoster Recombinat (Shingrix) 06/20/2020, 08/16/2020   (got [facility] HepA [facility] de[facility], 10/2010 given here) Last Pap smear:12/2016,normal, no high risk HPV Last mammogram: 10/2019 Last colonoscopy: 05/2018 with Dr. ManCollene Mares+adenomatous polyps, repeat 5 years. Last DEXA:09/2017: T-1.4 L fem neck Dentist: every 9 months Ophtho: yearly Exercise:walks40 minutes 4-5x/week. Sometimes uses weights at home, not frequent. Doing arm exercises without weights mostly.  Vitamin D-OH level 40 in 02/2013  Other doctors caring for patient include: GI: Dr. ManCollene Maresntist: Dr. NorMariea ClontsT: Dr. KraCresenciano Licko longer sees) ophtho:Dr.Shapiro Ortho: [facility]v [facility] Fall screen: Negative Functional Status screen: negative Mini-Cog screen: normal See full screens in Epic  End of Life Discussion: Patient hasa living will and medical power of attorney. She brought these in today to be scanned into chart.   PMH, PSH, SH and FH reviewed, updated  Outpatient Encounter Medications as of 10/24/2020  Medication Sig Note  . aspirin 81 MG tablet Take 81 mg by mouth daily.   . Black Pepper-Turmeric (TURMERIC COMPLEX/BLACK  PEPPER PO) Take 1 capsule by mouth daily. 08/31/2019: Turmeric and curcumin only  . Coenzyme Q10 (CO Q-10) 300 MG CAPS Take by mouth.   . levothyroxine  (SYNTHROID) 112 MCG tablet TAKE ONE TABLET BY MOUTH DAILY   . Multiple Vitamin (MULTIVITAMIN) tablet Take 1 tablet by mouth daily.   . sertraline (ZOLOFT) 50 MG tablet TAKE ONE TABLET BY MOUTH DAILY 10/24/2020: Taking 1/2 tablet 2x/week, full tablet other days  . simvastatin (ZOCOR) 40 MG tablet Take 1 tablet (40 mg total) by mouth daily.   . Naproxen Sodium (ALEVE PO) Take 220 mg by mouth as needed. (Patient not taking: Reported on 10/24/2020)    No facility-administered encounter medications on file as of 10/24/2020.   Allergies  Allergen Reactions  . Demerol Nausea And Vomiting  . Penicillins Rash    ROS: The patient denies anorexia, fever, weight changes, headaches, vision changes, decreased hearing, ear pain, sore throat, breast concerns, chest pain, palpitations, dizziness, syncope, dyspnea on exertion, cough, swelling, nausea, vomiting, diarrhea, constipation, abdominal pain, melena, hematochezia, hematuria, incontinence, dysuria, vaginal bleeding, discharge, odor or itch, genital lesions, numbness, tingling, weakness, tremor, suspicious skin lesions, depression, anxiety, abnormal bleeding/bruising, or enlarged lymph nodes.  Rare heartburn, no dysphagia.  L shoulder pain resolved. RLE discomfort at night, not every night, unchanged. She has some congestion in the mornings (sneezing, runny nose), and slight cough, intermittent, dry, notices when she lays down at night. Has been told she snores, denies unrefreshed sleep.   PHYSICAL EXAM:  BP 110/70   Pulse 72   Ht 5' 5"  (1.651 m)   Wt 149 lb (67.6 kg)   BMI 24.79 kg/m   Wt Readings from Last 3 Encounters:  10/24/20 149 lb (67.6 kg)  10/15/19 152 lb 9.6 oz (69.2 kg)  08/31/19 151 lb (68.5 kg)    General Appearance:   Alert, cooperative, no distress, appears stated age  Head:   Normocephalic, without obvious abnormality, atraumatic  Eyes:   PERRL, conjunctiva/corneas clear, EOM's intact, fundi benign  Ears:    Normal TM's and external ear canals.  Nose:  Not examined ,wearing mask due to COVID-19 pandemic  Throat:  Not examined, wearing mask due to COVID-19 pandemic  Neck:  Supple, no lymphadenopathy; thyroid:not enlarged, no tenderness/nodules; no carotidbruit or JVD  Back:  Spine nontender, no curvature, ROM normal, no CVA tenderness  Lungs:   Clear to auscultation bilaterally without wheezes, rales or ronchi; respirations unlabored  Chest Wall:   No tenderness or deformity  Heart:   Regular rate and rhythm, S1 and S2 normal, no murmur, rub or gallop  Breast Exam:   No masses, nipple discharge or inversion. She has mild fibroglandular changes noted bilaterally in UOQ's. There is a more prominent fibroglandular (?) area in the right UOQ (10 o'clock), focal ridge of tissue.  Nontender. No axillary lymphadenopathy  Abdomen:   Soft, non-tender, nondistended, normoactive bowel sounds, no masses, no hepatosplenomegaly  Genitalia:   Normal external genitalia without lesions; atrophic changes noted. BUS and vagina normal; no cervical motion tenderness. No abnormal vaginal discharge. Uterus and adnexa not enlarged, nontender, no masses. Pap notperformed  Rectal:   Normal tone, no masses or tenderness; guaiac negative stool. Nontender external hemorrhoid  Extremities:  No clubbing, cyanosis or edema.   Pulses:  2+ and symmetric all extremities  Skin:  Skin color, texture, turgor normal, no rashes or lesions  Lymph nodes:  Cervical, supraclavicular, and axillary nodes normal  Neurologic:  Normal strength, sensation and gait;  reflexes 2+ and symmetric throughout   Psych: Normal mood, affect, hygiene and grooming    ASSESSMENT/PLAN:  Annual physical exam - Plan: Lipid panel, Comprehensive metabolic panel, CBC with Differential/Platelet, TSH  Medicare annual wellness visit, subsequent  Pure hypercholesterolemia - due for recheck.  If above goal, consider change to crestor, o/w cont with simva (pt asking, brother changed) - Plan: Comprehensive metabolic panel  Hypothyroidism, unspecified type - euthyroid by history. - Plan: TSH  Medication monitoring encounter - Plan: Lipid panel, Comprehensive metabolic panel, CBC with Differential/Platelet, TSH  Anxiety state - well controlled on sertraline, cont. Discussed not trying to further taper at this point (pandemic, politics) - Plan: sertraline (ZOLOFT) 50 MG tablet  Lump of right breast - area has been monitored with diagnostic mammo's, due now. ?slightly increased in size? - Plan: CANCELED: MM Digital Diagnostic Bilat, CANCELED: US BREAST LTD UNI RIGHT INC AXILLA  Major depression in remission ([place]) - recent worsening of moods related to the holidays, well controlled now. Cont sertraline  Osteopenia of neck of left femur - DEXA next year. Reviewed prior results, mild osteopenia. Discussed Ca, D, weight-bearing exercise  TSH, c-met, lipid, CBC  Discussed monthly self breast exams and yearly mammograms (due, will schedule diagnostic mammo bilat, [place] R breast); at least 30 minutes of aerobic activity at least 5 days/week, weight bearing exercise at least 2x/wk; proper sunscreen use reviewed; healthy diet, including goals of calcium and vitamin D intake and alcohol recommendations (less than or equal to 1 drink/day) reviewed; regular seatbelt use; changing batteries in smoke detectors. Immunization recommendations discussed--continueyearly flu shots. TdaP due, to get from pharmacy. Colonoscopy recommendations reviewed, UTD (due again 05/2023). Pap smear next year.  Full Code, Full Care  F/u 1 year, sooner prn.   Medicare Attestation I have personally reviewed: The patient's medical and social history Their use of alcohol, tobacco or illicit drugs Their current medications and supplements The patient's functional ability including ADLs,fall risks, home safety risks,  cognitive, and hearing and visual impairment Diet and physical activities Evidence for depression or mood disorders

## 2020-10-23 NOTE — Patient Instructions (Incomplete)
  HEALTH MAINTENANCE RECOMMENDATIONS:  It is recommended that you get at least 30 minutes of aerobic exercise at least 5 days/week (for weight loss, you may need as much as 60-90 minutes). This can be any activity that gets your heart rate up. This can be divided in 10-15 minute intervals if needed, but try and build up your endurance at least once a week.  Weight bearing exercise is also recommended twice weekly.  Eat a healthy diet with lots of vegetables, fruits and fiber.  "Colorful" foods have a lot of vitamins (ie green vegetables, tomatoes, red peppers, etc).  Limit sweet tea, regular sodas and alcoholic beverages, all of which has a lot of calories and sugar.  Up to 1 alcoholic drink daily may be beneficial for women (unless trying to lose weight, watch sugars).  Drink a lot of water.  Calcium recommendations are 1200-1500 mg daily (1500 mg for postmenopausal women or women without ovaries), and vitamin D 1000 IU daily.  This should be obtained from diet and/or supplements (vitamins), and calcium should not be taken all at once, but in divided doses.  Monthly self breast exams and yearly mammograms for women over the age of 93 is recommended.  Sunscreen of at least SPF 30 should be used on all sun-exposed parts of the skin when outside between the hours of 10 am and 4 pm (not just when at beach or pool, but even with exercise, golf, tennis, and yard work!)  Use a sunscreen that says "broad spectrum" so it covers both UVA and UVB rays, and make sure to reapply every 1-2 hours.  Remember to change the batteries in your smoke detectors when changing your clock times in the spring and fall. Carbon monoxide detectors are recommended for your home.  Use your seat belt every time you are in a car, and please drive safely and not be distracted with cell phones and texting while driving.   Olivia Cunningham , Thank you for taking time to come for your Medicare Wellness Visit. I appreciate your ongoing  commitment to your health goals. Please review the following plan we discussed and let me know if I can assist you in the future.    This is a list of the screening recommended for you and due dates:  Health Maintenance  Topic Date Due  . Tetanus Vaccine  12/10/2019  . Mammogram  10/22/2020  . Colon Cancer Screening  06/03/2023  . Flu Shot  Completed  . DEXA scan (bone density measurement)  Completed  . COVID-19 Vaccine  Completed  .  Hepatitis C: One time screening is recommended by Center for Disease Control  (CDC) for  adults born from [birth year] through 1965.   Completed  . Pneumonia vaccines  Completed   You do not need to take aspirin daily.  Continue other medications. You do not need to try and taper down your sertraline any further.  We will be scheduling you for your diagnostic mammogram and contacting you.

## 2020-10-24 ENCOUNTER — Other Ambulatory Visit: Payer: Self-pay | Admitting: Family Medicine

## 2020-10-24 ENCOUNTER — Ambulatory Visit (INDEPENDENT_AMBULATORY_CARE_PROVIDER_SITE_OTHER): Payer: PPO | Admitting: Family Medicine

## 2020-10-24 ENCOUNTER — Encounter: Payer: Self-pay | Admitting: Family Medicine

## 2020-10-24 ENCOUNTER — Other Ambulatory Visit: Payer: Self-pay

## 2020-10-24 VITALS — BP 110/70 | HR 72 | Ht 65.0 in | Wt 149.0 lb

## 2020-10-24 DIAGNOSIS — N6310 Unspecified lump in the right breast, unspecified quadrant: Secondary | ICD-10-CM

## 2020-10-24 DIAGNOSIS — E7800 Pure hypercholesterolemia, unspecified: Secondary | ICD-10-CM

## 2020-10-24 DIAGNOSIS — Z0000 Encounter for general adult medical examination without abnormal findings: Secondary | ICD-10-CM

## 2020-10-24 DIAGNOSIS — E039 Hypothyroidism, unspecified: Secondary | ICD-10-CM | POA: Diagnosis not present

## 2020-10-24 DIAGNOSIS — Z5181 Encounter for therapeutic drug level monitoring: Secondary | ICD-10-CM

## 2020-10-24 DIAGNOSIS — M85852 Other specified disorders of bone density and structure, left thigh: Secondary | ICD-10-CM | POA: Diagnosis not present

## 2020-10-24 DIAGNOSIS — F325 Major depressive disorder, single episode, in full remission: Secondary | ICD-10-CM

## 2020-10-24 DIAGNOSIS — F411 Generalized anxiety disorder: Secondary | ICD-10-CM | POA: Diagnosis not present

## 2020-10-24 LAB — COMPREHENSIVE METABOLIC PANEL

## 2020-10-24 LAB — CBC WITH DIFFERENTIAL/PLATELET
Basophils Absolute: 0 10*3/uL (ref 0.0–0.2)
Basos: 1 %
EOS (ABSOLUTE): 0.1 10*3/uL (ref 0.0–0.4)
Eos: 2 %
Hemoglobin: 14.7 g/dL (ref 11.1–15.9)
Immature Granulocytes: 0 %
MCH: 30.5 pg (ref 26.6–33.0)
Neutrophils Absolute: 3 10*3/uL (ref 1.4–7.0)
RBC: 4.82 x10E6/uL (ref 3.77–5.28)

## 2020-10-24 LAB — LIPID PANEL

## 2020-10-24 MED ORDER — SERTRALINE HCL 50 MG PO TABS: 50.0000 mg | ORAL_TABLET | Freq: Every day | ORAL | 3 refills | Status: DC

## 2020-10-25 LAB — CBC WITH DIFFERENTIAL/PLATELET
Hematocrit: 45.1 % (ref 34.0–46.6)
Immature Grans (Abs): 0 10*3/uL (ref 0.0–0.1)
Lymphocytes Absolute: 1.8 10*3/uL (ref 0.7–3.1)
Lymphs: 32 %
MCHC: 32.6 g/dL (ref 31.5–35.7)
MCV: 94 fL (ref 79–97)
Monocytes Absolute: 0.6 10*3/uL (ref 0.1–0.9)
Monocytes: 11 %
Neutrophils: 54 %
Platelets: 233 10*3/uL (ref 150–450)
RDW: 12 % (ref 11.7–15.4)
WBC: 5.6 10*3/uL (ref 3.4–10.8)

## 2020-10-25 LAB — COMPREHENSIVE METABOLIC PANEL
Albumin/Globulin Ratio: 1.8 (ref 1.2–2.2)
Albumin: 4.5 g/dL (ref 3.8–4.8)
Alkaline Phosphatase: 56 IU/L (ref 44–121)
BUN/Creatinine Ratio: 14 (ref 12–28)
BUN: 15 mg/dL (ref 8–27)
Bilirubin Total: 0.4 mg/dL (ref 0.0–1.2)
CO2: 24 mmol/L (ref 20–29)
Calcium: 9.9 mg/dL (ref 8.7–10.3)
Chloride: 97 mmol/L (ref 96–106)
Creatinine, Ser: 1.04 mg/dL — ABNORMAL HIGH (ref 0.57–1.00)
GFR calc Af Amer: 63 mL/min/{1.73_m2} (ref >59)
GFR calc non Af Amer: 55 mL/min/{1.73_m2} — ABNORMAL LOW (ref >59)
Globulin, Total: 2.5 g/dL (ref 1.5–4.5)
Glucose: 84 mg/dL (ref 65–99)
Potassium: 4.8 mmol/L (ref 3.5–5.2)
Total Protein: 7 g/dL (ref 6.0–8.5)

## 2020-10-25 LAB — LIPID PANEL
Chol/HDL Ratio: 3.6 ratio (ref 0.0–4.4)
Cholesterol, Total: 196 mg/dL (ref 100–199)
LDL Chol Calc (NIH): 119 mg/dL — ABNORMAL HIGH (ref 0–99)
Triglycerides: 123 mg/dL (ref 0–149)

## 2020-10-25 LAB — TSH: TSH: 2.23 u[IU]/mL (ref 0.450–4.500)

## 2020-10-25 MED ORDER — LEVOTHYROXINE SODIUM 112 MCG PO TABS: 112.0000 ug | ORAL_TABLET | Freq: Every day | ORAL | 3 refills | Status: DC

## 2020-10-25 MED ORDER — SIMVASTATIN 40 MG PO TABS: 40.0000 mg | ORAL_TABLET | Freq: Every day | ORAL | 3 refills | Status: DC

## 2020-12-06 ENCOUNTER — Ambulatory Visit
Admission: RE | Admit: 2020-12-06 | Discharge: 2020-12-06 | Disposition: A | Discharge status: Home / Self Care | Payer: PPO | Source: Ambulatory Visit | Attending: Family Medicine | Admitting: Family Medicine

## 2020-12-06 ENCOUNTER — Other Ambulatory Visit: Payer: Self-pay

## 2020-12-06 DIAGNOSIS — N6310 Unspecified lump in the right breast, unspecified quadrant: Secondary | ICD-10-CM

## 2020-12-06 DIAGNOSIS — N6489 Other specified disorders of breast: Secondary | ICD-10-CM | POA: Diagnosis not present

## 2020-12-06 DIAGNOSIS — R922 Inconclusive mammogram: Secondary | ICD-10-CM | POA: Diagnosis not present

## 2020-12-20 ENCOUNTER — Encounter: Payer: Self-pay | Admitting: Family Medicine

## 2021-01-04 DIAGNOSIS — Z20822 Contact with and (suspected) exposure to covid-19: Secondary | ICD-10-CM | POA: Diagnosis not present

## 2021-02-22 DIAGNOSIS — R2231 Localized swelling, mass and lump, right upper limb: Secondary | ICD-10-CM | POA: Diagnosis not present

## 2021-02-28 DIAGNOSIS — R2231 Localized swelling, mass and lump, right upper limb: Secondary | ICD-10-CM | POA: Diagnosis not present

## 2021-02-28 DIAGNOSIS — M24841 Other specific joint derangements of right hand, not elsewhere classified: Secondary | ICD-10-CM | POA: Diagnosis not present

## 2021-03-06 ENCOUNTER — Other Ambulatory Visit: Payer: Self-pay | Admitting: Orthopedic Surgery

## 2021-03-06 DIAGNOSIS — R2231 Localized swelling, mass and lump, right upper limb: Secondary | ICD-10-CM | POA: Diagnosis not present

## 2021-03-29 ENCOUNTER — Encounter (HOSPITAL_BASED_OUTPATIENT_CLINIC_OR_DEPARTMENT_OTHER): Payer: Self-pay | Admitting: Orthopedic Surgery

## 2021-03-29 ENCOUNTER — Other Ambulatory Visit: Payer: Self-pay

## 2021-03-30 NOTE — Progress Notes (Signed)

## 2021-04-03 ENCOUNTER — Ambulatory Visit (HOSPITAL_BASED_OUTPATIENT_CLINIC_OR_DEPARTMENT_OTHER): Payer: PPO | Admitting: Anesthesiology

## 2021-04-04 ENCOUNTER — Encounter (HOSPITAL_BASED_OUTPATIENT_CLINIC_OR_DEPARTMENT_OTHER)
Admission: RE | Disposition: A | Discharge status: Home / Self Care | Payer: Self-pay | Source: Home / Self Care | Attending: Orthopedic Surgery

## 2021-04-04 ENCOUNTER — Encounter (HOSPITAL_BASED_OUTPATIENT_CLINIC_OR_DEPARTMENT_OTHER): Payer: Self-pay | Admitting: Orthopedic Surgery

## 2021-04-04 ENCOUNTER — Ambulatory Visit (HOSPITAL_BASED_OUTPATIENT_CLINIC_OR_DEPARTMENT_OTHER)
Admission: RE | Admit: 2021-04-04 | Discharge: 2021-04-04 | Disposition: A | Discharge status: Home / Self Care | Payer: PPO | Attending: Orthopedic Surgery | Admitting: Orthopedic Surgery

## 2021-04-04 ENCOUNTER — Other Ambulatory Visit: Payer: Self-pay

## 2021-04-04 HISTORY — DX: Other specified postprocedural states: Z98.890

## 2021-04-04 HISTORY — DX: Gastro-esophageal reflux disease without esophagitis: K21.9

## 2021-04-04 HISTORY — DX: Hypothyroidism, unspecified: E03.9

## 2021-04-04 HISTORY — DX: Nausea with vomiting, unspecified: R11.2

## 2021-04-04 SURGERY — CANCELLED PROCEDURE
Anesthesia: Monitor Anesthesia Care | Laterality: Right

## 2021-04-04 MED ORDER — CLINDAMYCIN PHOSPHATE 900 MG/50ML IV SOLN
INTRAVENOUS | Status: AC
  Filled 2021-04-04: qty 50

## 2021-04-04 MED ORDER — FENTANYL CITRATE (PF) 100 MCG/2ML IJ SOLN
INTRAMUSCULAR | Status: AC
  Filled 2021-04-04: qty 2

## 2021-04-04 MED ORDER — CLINDAMYCIN PHOSPHATE 900 MG/50ML IV SOLN: 900.0000 mg | INTRAVENOUS | Status: DC

## 2021-04-04 MED ORDER — LACTATED RINGERS IV SOLN: INTRAVENOUS | Status: DC

## 2021-04-04 MED ORDER — BUPIVACAINE HCL (PF) 0.25 % IJ SOLN
INTRAMUSCULAR | Status: AC
  Filled 2021-04-04: qty 30

## 2021-04-04 NOTE — H&P (Signed)
Mass has gotten smaller and she would like to wait

## 2021-04-04 NOTE — Anesthesia Preprocedure Evaluation (Addendum)
Anesthesia Evaluation  Patient identified by MRN, date of birth, ID band Patient awake    Reviewed: Allergy & Precautions, H&P , NPO status , Patient's Chart, lab work & pertinent test results, reviewed documented beta blocker date and time   History of Anesthesia Complications (+) PONV and history of anesthetic complications  Airway Mallampati: II  TM Distance: >3 FB Neck ROM: full    Dental no notable dental hx.    Pulmonary neg pulmonary ROS,    Pulmonary exam normal breath sounds clear to auscultation       Cardiovascular Exercise Tolerance: Good + CABG  negative cardio ROS   Rhythm:regular Rate:Normal     Neuro/Psych PSYCHIATRIC DISORDERS Anxiety Depression negative neurological ROS     GI/Hepatic Neg liver ROS, GERD  ,  Endo/Other  Hypothyroidism   Renal/GU negative Renal ROS  negative genitourinary   Musculoskeletal   Abdominal   Peds  Hematology negative hematology ROS (+)   Anesthesia Other Findings   Reproductive/Obstetrics negative OB ROS                            Anesthesia Physical Anesthesia Plan  ASA: 2  Anesthesia Plan: MAC and Bier Block and Bier Block-LIDOCAINE ONLY   Post-op Pain Management:    Induction: Intravenous  PONV Risk Score and Plan: 2 and Ondansetron, Propofol infusion and Midazolam  Airway Management Planned: Natural Airway and Nasal Cannula  Additional Equipment: None  Intra-op Plan:   Post-operative Plan: Extubation in OR  Informed Consent: I have reviewed the patients History and Physical, chart, labs and discussed the procedure including the risks, benefits and alternatives for the proposed anesthesia with the patient or authorized representative who has indicated his/her understanding and acceptance.     Dental advisory given  Plan Discussed with: CRNA and Anesthesiologist  Anesthesia Plan Comments:        Anesthesia Quick  Evaluation

## 2021-06-07 ENCOUNTER — Encounter: Payer: Self-pay | Admitting: Family Medicine

## 2021-06-07 ENCOUNTER — Encounter: Payer: Self-pay | Admitting: *Deleted

## 2021-09-06 ENCOUNTER — Encounter: Payer: Self-pay | Admitting: Family Medicine

## 2021-09-06 ENCOUNTER — Telehealth (INDEPENDENT_AMBULATORY_CARE_PROVIDER_SITE_OTHER): Payer: PPO | Admitting: Family Medicine

## 2021-09-06 ENCOUNTER — Other Ambulatory Visit: Payer: Self-pay

## 2021-09-06 VITALS — Temp 98.1°F | Wt 144.0 lb

## 2021-09-06 DIAGNOSIS — R051 Acute cough: Secondary | ICD-10-CM | POA: Diagnosis not present

## 2021-09-06 DIAGNOSIS — U071 COVID-19: Secondary | ICD-10-CM

## 2021-09-06 MED ORDER — MOLNUPIRAVIR EUA 200MG CAPSULE: 4.0000 | ORAL_CAPSULE | Freq: Two times a day (BID) | ORAL | 0 refills | Status: AC

## 2021-09-06 MED ORDER — BENZONATATE 200 MG PO CAPS
200.0000 mg | ORAL_CAPSULE | Freq: Three times a day (TID) | ORAL | 0 refills | Status: DC | PRN
Start: 2021-09-06 — End: 2021-10-04

## 2021-09-06 NOTE — Patient Instructions (Signed)
Day 0 is 12/26 Days 1-5 12/27-31--these are the isolation dates. You may leave house but wear mask 100% of the time (don't eat with others) on days 6-10 (1/1-5). On day 11 (1/6), you are no longer considered contagious.  Stay well hydrated You may continue to take the Mucinex-Day and Night as directed on package (versus changing to 12 hour mucinex DM tablets, and taking short-acting sudafed and tylenol separately, if needed.) We prescribed benzonatate/tessalon cough medication, which can be used if needed for cough. If cough keeps you awake at night, then we can send in a prescription for a hydrocodone-containing syrup.  Just let us know. That medication would be sedating, and you can't drive while taking it, so really just to use it at bedtime.  We discussed molnupiravir (anti-viral medication for COVID).  This needs to be started within the first 5 days of symptoms.  I sent it to your pharmacy.If you elect to start it, be sure to start it within the first 5 days of symptoms, and take it for 5 days.  Contact us if you are having worsening symptoms--prolonged high fevers, pain with breathing, shortness of breath, chest pain, leg swelling/pain, any neurologic changes or other concerns.

## 2021-09-06 NOTE — Progress Notes (Signed)
Start time: 1:30 End time: 1:57  Virtual Visit via Video Note  I connected with Olivia Cunningham on 09/06/21 by a video enabled telemedicine application and verified that I am speaking with the correct person using two identifiers.  Location: Patient: home Provider: office   I discussed the limitations of evaluation and management by telemedicine and the availability of in person appointments. The patient expressed understanding and agreed to proceed.  History of Present Illness:  Chief Complaint  Patient presents with   other    Positive for covid this morning, cough, body aches, slight fever, head ache   She was recently out of town.  No known sick contacts Woke up 12/26 with a "raging headache" and the "beginnings of a cold".  COVID test was negative that day. That night she developed body aches, felt cold, couldn't sleep.  He had urinary frequency that night. She also developed nausea, never vomited. Yesterday the HA was better, but worsening cold symptoms.  She started taking Mucinex Day (tylenol, DM, guaifenesin and phenylephrine) and Mucinex Night--also has antihistamine, and doesn't have guaifenesin.  Slept a little better last night after taking this. She woke up with a sore neck, which is what she usually gets with a fever.  Temp was 99.8. Body aches are better today. She is coughing, nonproductive.  Nasal drainage is clear.  PMH, PSH, SH reviewed She has had COVID vaccines x 5, including bivalent booster in September.  Outpatient Encounter Medications as of 09/06/2021  Medication Sig Note   Black Pepper-Turmeric (TURMERIC COMPLEX/BLACK PEPPER PO) Take 1 capsule by mouth daily. 08/31/2019: Turmeric and curcumin only   Coenzyme Q10 (CO Q-10) 300 MG CAPS Take by mouth.    levothyroxine (SYNTHROID) 112 MCG tablet Take 1 tablet (112 mcg total) by mouth daily.    Multiple Vitamin (MULTIVITAMIN) tablet Take 1 tablet by mouth daily.    Naproxen Sodium (ALEVE PO) Take 220 mg  by mouth as needed.    sertraline (ZOLOFT) 50 MG tablet Take 1 tablet (50 mg total) by mouth daily.    simvastatin (ZOCOR) 40 MG tablet Take 1 tablet (40 mg total) by mouth daily.    No facility-administered encounter medications on file as of 09/06/2021.   Allergies  Allergen Reactions   Demerol Nausea And Vomiting   Penicillins Rash    Some nausea. No vomiting or diarrhea, no rashes. No chest pain or shortness of breath.    Observations/Objective:  Temp 98.1 F (36.7 C)    Wt 144 lb (65.3 kg)    BMI 23.96 kg/m   Well-appearing female, alert and oriented. She sounds a little congested, and has occasional bouts of nonproductive cough during the visit. She is speaking comfortably, in no distress Cranial nerves are grossly normal. Exam is limited due to virtual nature of the visit.  Assessment and Plan:  COVID-19 virus infection - counseled re: supportive measures, risks/SE of antiviral meds, isolation. - Plan: molnupiravir EUA (LAGEVRIO) 200 mg CAPS capsule  Acute cough - Plan: benzonatate (TESSALON) 200 MG capsule  Day 0 is 12/26 Days 1-5 12/27-31--these are the isolation dates. You may leave house but wear mask 100% of the time (don't eat with others) on days 6-10 (1/1-5). On day 11 (1/6), you are no longer considered contagious.  Stay well hydrated You may continue to take the Mucinex-Day and Night as directed on package (versus changing to 12 hour mucinex DM tablets, and taking short-acting sudafed and tylenol separately, if needed.) We prescribed benzonatate/tessalon cough  medication, which can be used if needed for cough. If cough keeps you awake at night, then we can send in a prescription for a hydrocodone-containing syrup.  Just let us know. That medication would be sedating, and you can't drive while taking it, so really just to use it at bedtime.  We discussed molnupiravir (anti-viral medication for COVID).  This needs to be started within the first 5 days of  symptoms.  I sent it to your pharmacy.If you elect to start it, be sure to start it within the first 5 days of symptoms, and take it for 5 days.  Contact us if you are having worsening symptoms--prolonged high fevers, pain with breathing, shortness of breath, chest pain, leg swelling/pain, any neurologic changes or other concerns.   Follow Up Instructions:    I discussed the assessment and treatment plan with the patient. The patient was provided an opportunity to ask questions and all were answered. The patient agreed with the plan and demonstrated an understanding of the instructions.   The patient was advised to call back or seek an in-person evaluation if the symptoms worsen or if the condition fails to improve as anticipated.  I spent 30 minutes dedicated to the care of this patient, including pre-visit review of records, face to face time, post-visit ordering of testing and documentation.    Vikki Ports, MD

## 2021-10-04 ENCOUNTER — Ambulatory Visit: Payer: PPO | Admitting: Podiatry

## 2021-10-04 ENCOUNTER — Other Ambulatory Visit: Payer: Self-pay

## 2021-10-04 DIAGNOSIS — L600 Ingrowing nail: Secondary | ICD-10-CM

## 2021-10-04 NOTE — Progress Notes (Signed)
Subjective:   Patient ID: Olivia Cunningham, female   DOB: 71 y.o.   MRN: [id number]   HPI Presents stating that she has had discoloration of both her great toenails and that the left one is coming off its bed and its been loose.  Patient does not smoke and would like to be active   Review of Systems  All other systems reviewed and are negative.      Objective:  Physical Exam Vitals and nursing note reviewed.  Constitutional:      Appearance: She is well-developed.  Pulmonary:     Effort: Pulmonary effort is normal.  Musculoskeletal:        General: Normal range of motion.  Skin:    General: Skin is warm.  Neurological:     Mental Status: She is alert.    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  I anesthetized the left hallux 60 mg like Marcaine mixture sterile prep done remove the hallux nail under sterile condition flushed the wound applied sterile dressing reappoint to recheck

## 2021-10-04 NOTE — Patient Instructions (Signed)

## 2021-11-06 ENCOUNTER — Other Ambulatory Visit: Payer: Self-pay | Admitting: Family Medicine

## 2021-11-06 ENCOUNTER — Encounter: Payer: Self-pay | Admitting: Family Medicine

## 2021-11-06 DIAGNOSIS — E039 Hypothyroidism, unspecified: Secondary | ICD-10-CM

## 2021-11-06 DIAGNOSIS — E7800 Pure hypercholesterolemia, unspecified: Secondary | ICD-10-CM

## 2021-11-06 DIAGNOSIS — F411 Generalized anxiety disorder: Secondary | ICD-10-CM

## 2021-11-06 NOTE — Telephone Encounter (Signed)
Left message asking patient if she needs these filled prior to 11/27/21 appt.

## 2021-11-10 ENCOUNTER — Other Ambulatory Visit: Payer: Self-pay | Admitting: Family Medicine

## 2021-11-10 DIAGNOSIS — E039 Hypothyroidism, unspecified: Secondary | ICD-10-CM

## 2021-11-26 NOTE — Progress Notes (Signed)
Chief Complaint  ?Patient presents with  ? Medicare Wellness  ?  Fasting AWV/CPE no concerns. Has not gotten Tdap yet. Will schedule mammogram. Does not want to have pap done today.   ? ? ?Olivia Cunningham is a 71 y.o. female who presents for a complete physical and Medicare annual wellness visit.  ? ?She is planning to move to a 1 story townhome (downsizing), moving out in April, moving into new home mid-June. She has been very busy packing/organizing. ? ?Hyperlipidemia follow-up: She continues to take '40mg'$  of simvastatin daily, without side effects. She stopped coenzyme Q10 (along with other supplements) when she thought she would have hand surgery (ganglion cyst resolved on its own, didn't need surgery). She never restarted it and hasn't had any myalgias. ?Occasionally has some R leg pain/sciatica only when she lays on her back to sleep.  ?She has some known DDD.  Denies RLE discomfort during the day or with activity/walking. ?She is due for recheck. ?Lab Results  ?Component Value Date  ? CHOL 196 10/24/2020  ? HDL 55 10/24/2020  ? LDLCALC 119 (H) 10/24/2020  ? TRIG 123 10/24/2020  ? CHOLHDL 3.6 10/24/2020  ?  ?Depression/Anxiety: She decreased zoloft dose back to '50mg'$  after she retired. Since then she has further cut back to 1/2 tablet twice a week, full the other days.  She had to bump back up to '50mg'$  daiily at one point in the past (Thanksgiving 2021), but was able to get the dose back down. She continues on the slightly lower dose.  She is living in "clutter and chaos" related to the move. Sometimes having some early wakening and trouble getting back to sleep (thinking of what she needs to do).  Denies any significant anxiety. ?   ?Hypothyroidism: She takes her thyroid medication when she first wakes up, separate from food and other medications/vitamins. Takes zoloft 45 minutes later, and MVI at dinnertime.  She denies any symptoms--just mild hair thinning that she relates to menopause, unchanged. No  skin/bowel changes, weight, energy or mood changes. She is due for recheck today. She thinks she has missed some (when has an early appointment and has to eat something, will skip it).  Not aware of any recent missed doses. ?Lab Results  ?Component Value Date  ? TSH 2.230 10/24/2020  ? ? ?Immunization History  ?Administered Date(s) Administered  ? Hepatitis A 11/06/2010  ? Influenza, High Dose Seasonal PF 05/07/2016, 05/27/2019, 06/20/2020  ? Influenza,inj,Quad PF,6+ Mos 09/21/2015  ? Influenza-Unspecified 04/10/2018, 06/07/2021  ? PFIZER(Purple Top)SARS-COV-2 Vaccination 10/15/2019, 11/10/2019, 06/06/2020, 12/19/2020  ? Pension scheme manager 40yr & up 06/07/2021  ? Pneumococcal Conjugate-13 12/26/2016  ? Pneumococcal Polysaccharide-23 02/20/2018  ? Td 01/08/2006  ? Tdap 12/09/2009  ? Zoster Recombinat (Shingrix) 06/20/2020, 08/16/2020  ? Zoster, Live 09/21/2015  ? ?(got [facility] HepA [facility], 10/2010 given here)  ?Last Pap smear: 12/2016, normal, no high risk HPV ?Last mammogram: 11/2020 ?Last colonoscopy: 05/2018 with Dr. MCollene Mares  +adenomatous polyps, repeat 5 years. ?Last DEXA: 09/2017: T-1.4 L fem neck ?Dentist: every 9 months ?Ophtho: yearly ?Exercise:  Limited exercise after COVID in December (due to fatigue), and since January as been busy working on packing/selling home.  Up and down her steps a lot.  Walking some, less often. Prior to December, had been walking 4-5x/week. ?Getting some weight-bearing exercise with moving. ?  ?Vitamin D-OH level 40 in 02/2013 ? ?Patient Care Team: ?KRita Ohara MD as PCP - General (Family Medicine) ?GI: Dr.  Mann ?Dentist: Dr. Mariea Clonts ?ENT: Dr. Cresenciano Lick (no longer sees) ?ophtho: Dr. Gershon Crane ?Ortho: Prev saw Dr. Alphonzo Cruise and [facility] at [facility] ?Podiatrist: Dr. Paulla Dolly  ?Hand surgeon: Dr. Fredna Dow ? ?Depression Screening: ?[facility] Office Visit from 11/27/2021 in [place]  ?PHQ-2 Total Score 0  ? ?  ?  ? ?Falls screen:  ?Fall Risk  11/27/2021 10/24/2020  08/31/2019 04/08/2019 02/20/2018  ?Falls in the past year? 0 0 0 0 No  ?Comment - - - Emmi Telephone Survey: data to providers prior to load -  ?Number falls in past yr: 0 - - - -  ?Injury with Fall? 0 - - - -  ?Risk for fall due to : No Fall Risks - - - -  ?Follow up Falls evaluation completed - - - -  ?  ? ?Functional Status Survey: ?Is the patient deaf or have difficulty hearing?: No ?Does the patient have difficulty seeing, even when wearing glasses/contacts?: Yes (sometimes blurry) ?Does the patient have difficulty concentrating, remembering, or making decisions?: No ?Does the patient have difficulty walking or climbing stairs?: No ?Does the patient have difficulty dressing or bathing?: No ?Does the patient have difficulty doing errands alone such as visiting a doctor's office or shopping?: No ? ?Mini-Cog Scoring: 5  ?  ?End of Life Discussion:  Patient has a living will and medical power of attorney, scanned into chart. ? ? ?PMH, PSH, SH and FH reviewed, updated ? ?Outpatient Encounter Medications as of 11/27/2021  ?Medication Sig Note  ? levothyroxine (SYNTHROID) 112 MCG tablet Take 1 tablet (112 mcg total) by mouth daily.   ? Multiple Vitamin (MULTIVITAMIN) tablet Take 1 tablet by mouth daily.   ? sertraline (ZOLOFT) 50 MG tablet Take 1 tablet (50 mg total) by mouth daily. 11/27/2021: Takes 1/2 tablet 2x/week, full the other 5 days  ? simvastatin (ZOCOR) 40 MG tablet TAKE ONE TABLET BY MOUTH DAILY   ? Naproxen Sodium (ALEVE PO) Take 220 mg by mouth as needed. (Patient not taking: Reported on 11/27/2021) 11/27/2021: As needed  ? [DISCONTINUED] Black Pepper-Turmeric (TURMERIC COMPLEX/BLACK PEPPER PO) Take 1 capsule by mouth daily. 08/31/2019: Turmeric and curcumin only  ? ?No facility-administered encounter medications on file as of 11/27/2021.  ? ?Allergies  ?Allergen Reactions  ? Demerol Nausea And Vomiting  ? Penicillins Rash  ? ? ?ROS:  The patient denies anorexia, fever, weight changes, headaches,  vision  changes, decreased hearing, ear pain, sore throat, breast concerns, chest pain, palpitations, dizziness, syncope, dyspnea on exertion, cough, swelling, nausea, vomiting, diarrhea, constipation, abdominal pain, melena, hematochezia, hematuria, incontinence, dysuria, vaginal bleeding, discharge, odor or itch, genital lesions, numbness, tingling, weakness, tremor, suspicious skin lesions, depression, anxiety, abnormal bleeding/bruising, or enlarged lymph nodes.  ?Rare heartburn, no dysphagia.  ?Some pain down the back of the R leg if she sleeps on her back. ?She has some congestion in the mornings (sneezing, runny nose). Denies any cough. Slight crusting in the mornings, slight creamy drainage in the mornings only. No itching. ?Has been told she snores, denies unrefreshed sleep. ?Ganglion cyst on R ring finger resolved spontaneously (surgery was cancelled). ? ? ?PHYSICAL EXAM: ? ?BP 122/80   Pulse 76   Ht 5' 3.5" (1.613 m)   Wt 141 lb 9.6 oz (64.2 kg)   BMI 24.69 kg/m?  ? ?Wt Readings from Last 3 Encounters:  ?11/27/21 141 lb 9.6 oz (64.2 kg)  ?09/06/21 144 lb (65.3 kg)  ?04/04/21 144 lb 10 oz (65.6 kg)  ? ? ?General Appearance:  Alert, cooperative, no distress, appears stated age   ?Head:     Normocephalic, without obvious abnormality, atraumatic   ?Eyes:     PERRL, conjunctiva/corneas clear, EOM's intact, fundi benign   ?Ears:     Normal TM's and external ear canals.  ?Nose:    Not examined ,wearing mask due to COVID-19 pandemic  ?Throat:    Not examined, wearing mask due to COVID-19 pandemic   ?Neck:    Supple, no lymphadenopathy;  thyroid: not enlarged, no tenderness/nodules; no carotid bruit or JVD   ?Back:     Spine nontender, no curvature, ROM normal, no CVA tenderness   ?Lungs:      Clear to auscultation bilaterally without wheezes, rales or ronchi; respirations unlabored   ?Chest Wall:     No tenderness or deformity   ? Heart:     Regular rate and rhythm, S1 and S2 normal, no murmur, rub or gallop    ?Breast Exam:     No masses, nipple discharge or inversion.  She has mild fibroglandular changes noted bilaterally in UOQ's.  She continues to have a more prominent fibroglandular area in the right UOQ (10 o'clock), with a focal

## 2021-11-26 NOTE — Patient Instructions (Addendum)
?  HEALTH MAINTENANCE RECOMMENDATIONS: ? ?It is recommended that you get at least 30 minutes of aerobic exercise at least 5 days/week (for weight loss, you may need as much as 60-90 minutes). This can be any activity that gets your heart rate up. This can be divided in 10-15 minute intervals if needed, but try and build up your endurance at least once a week.  Weight bearing exercise is also recommended twice weekly. ? ?Eat a healthy diet with lots of vegetables, fruits and fiber.  "Colorful" foods have a lot of vitamins (ie green vegetables, tomatoes, red peppers, etc).  Limit sweet tea, regular sodas and alcoholic beverages, all of which has a lot of calories and sugar.  Up to 1 alcoholic drink daily may be beneficial for women (unless trying to lose weight, watch sugars).  Drink a lot of water. ? ?Calcium recommendations are 1200-1500 mg daily (1500 mg for postmenopausal women or women without ovaries), and vitamin D 1000 IU daily.  This should be obtained from diet and/or supplements (vitamins), and calcium should not be taken all at once, but in divided doses. ? ?Monthly self breast exams and yearly mammograms for women over the age of 36 is recommended. ? ?Sunscreen of at least SPF 30 should be used on all sun-exposed parts of the skin when outside between the hours of 10 am and 4 pm (not just when at beach or pool, but even with exercise, golf, tennis, and yard work!)  Use a sunscreen that says "broad spectrum" so it covers both UVA and UVB rays, and make sure to reapply every 1-2 hours. ? ?Remember to change the batteries in your smoke detectors when changing your clock times in the spring and fall. Carbon monoxide detectors are recommended for your home. ? ?Use your seat belt every time you are in a car, and please drive safely and not be distracted with cell phones and texting while driving. ? ? ?Olivia Cunningham , ?Thank you for taking time to come for your Medicare Wellness Visit. I appreciate your ongoing  commitment to your health goals. Please review the following plan we discussed and let me know if I can assist you in the future.  ? ?This is a list of the screening recommended for you and due dates:  ?Health Maintenance  ?Topic Date Due  ? Tetanus Vaccine  12/10/2019  ? Mammogram  12/06/2021  ? Colon Cancer Screening  06/03/2023  ? Pneumonia Vaccine  Completed  ? Flu Shot  Completed  ? DEXA scan (bone density measurement)  Completed  ? COVID-19 Vaccine  Completed  ? Hepatitis C Screening: USPSTF Recommendation to screen - Ages 70-79 yo.  Completed  ? Zoster (Shingles) Vaccine  Completed  ? HPV Vaccine  Aged Out  ? ?We will repeat a bone density test next year (5 years from the last, which showed mild thinning of the bones). ?You are due for your mammogram, please schedule. ? ?Please get a tetanus booster (TdaP) from the pharmacy.  ?Please schedule your mammogram. ?

## 2021-11-27 ENCOUNTER — Encounter: Payer: Self-pay | Admitting: Family Medicine

## 2021-11-27 ENCOUNTER — Ambulatory Visit (INDEPENDENT_AMBULATORY_CARE_PROVIDER_SITE_OTHER): Payer: PPO | Admitting: Family Medicine

## 2021-11-27 ENCOUNTER — Other Ambulatory Visit: Payer: Self-pay

## 2021-11-27 VITALS — BP 122/80 | HR 76 | Ht 63.5 in | Wt 141.6 lb

## 2021-11-27 DIAGNOSIS — F325 Major depressive disorder, single episode, in full remission: Secondary | ICD-10-CM | POA: Diagnosis not present

## 2021-11-27 DIAGNOSIS — E039 Hypothyroidism, unspecified: Secondary | ICD-10-CM

## 2021-11-27 DIAGNOSIS — Z5181 Encounter for therapeutic drug level monitoring: Secondary | ICD-10-CM | POA: Diagnosis not present

## 2021-11-27 DIAGNOSIS — E7800 Pure hypercholesterolemia, unspecified: Secondary | ICD-10-CM

## 2021-11-27 DIAGNOSIS — Z0000 Encounter for general adult medical examination without abnormal findings: Secondary | ICD-10-CM | POA: Diagnosis not present

## 2021-11-27 DIAGNOSIS — F411 Generalized anxiety disorder: Secondary | ICD-10-CM

## 2021-11-27 DIAGNOSIS — M85852 Other specified disorders of bone density and structure, left thigh: Secondary | ICD-10-CM

## 2021-11-27 MED ORDER — SERTRALINE HCL 50 MG PO TABS: 50.0000 mg | ORAL_TABLET | Freq: Every day | ORAL | 3 refills | Status: DC

## 2021-11-28 LAB — LIPID PANEL
Chol/HDL Ratio: 3.9 ratio (ref 0.0–4.4)
Cholesterol, Total: 218 mg/dL — ABNORMAL HIGH (ref 100–199)
HDL: 56 mg/dL (ref >39)
LDL Chol Calc (NIH): 133 mg/dL — ABNORMAL HIGH (ref 0–99)
Triglycerides: 163 mg/dL — ABNORMAL HIGH (ref 0–149)
VLDL Cholesterol Cal: 29 mg/dL (ref 5–40)

## 2021-11-28 LAB — CBC WITH DIFFERENTIAL/PLATELET
Basophils Absolute: 0 10*3/uL (ref 0.0–0.2)
Basos: 1 %
EOS (ABSOLUTE): 0.1 10*3/uL (ref 0.0–0.4)
Eos: 2 %
Hematocrit: 43.4 % (ref 34.0–46.6)
Hemoglobin: 14.4 g/dL (ref 11.1–15.9)
Immature Grans (Abs): 0 10*3/uL (ref 0.0–0.1)
Immature Granulocytes: 0 %
Lymphocytes Absolute: 1.9 10*3/uL (ref 0.7–3.1)
Lymphs: 37 %
MCH: 30.6 pg (ref 26.6–33.0)
MCHC: 33.2 g/dL (ref 31.5–35.7)
MCV: 92 fL (ref 79–97)
Monocytes Absolute: 0.5 10*3/uL (ref 0.1–0.9)
Monocytes: 10 %
Neutrophils Absolute: 2.5 10*3/uL (ref 1.4–7.0)
Neutrophils: 50 %
Platelets: 236 10*3/uL (ref 150–450)
RBC: 4.7 x10E6/uL (ref 3.77–5.28)
RDW: 11.8 % (ref 11.7–15.4)
WBC: 5.1 10*3/uL (ref 3.4–10.8)

## 2021-11-28 LAB — COMPREHENSIVE METABOLIC PANEL
ALT: 10 IU/L (ref 0–32)
AST: 14 IU/L (ref 0–40)
Albumin/Globulin Ratio: 1.6 (ref 1.2–2.2)
Albumin: 4.4 g/dL (ref 3.8–4.8)
Alkaline Phosphatase: 53 IU/L (ref 44–121)
BUN/Creatinine Ratio: 21 (ref 12–28)
BUN: 19 mg/dL (ref 8–27)
Bilirubin Total: 0.4 mg/dL (ref 0.0–1.2)
CO2: 26 mmol/L (ref 20–29)
Calcium: 9.3 mg/dL (ref 8.7–10.3)
Chloride: 101 mmol/L (ref 96–106)
Creatinine, Ser: 0.9 mg/dL (ref 0.57–1.00)
Globulin, Total: 2.7 g/dL (ref 1.5–4.5)
Glucose: 95 mg/dL (ref 70–99)
Potassium: 4.9 mmol/L (ref 3.5–5.2)
Sodium: 140 mmol/L (ref 134–144)
Total Protein: 7.1 g/dL (ref 6.0–8.5)
eGFR: 69 mL/min/{1.73_m2} (ref >59)

## 2021-11-28 LAB — TSH: TSH: 1.57 u[IU]/mL (ref 0.450–4.500)

## 2021-11-28 MED ORDER — LEVOTHYROXINE SODIUM 112 MCG PO TABS: 112.0000 ug | ORAL_TABLET | Freq: Every day | ORAL | 3 refills | Status: DC

## 2022-01-08 ENCOUNTER — Other Ambulatory Visit: Payer: Self-pay | Admitting: Family Medicine

## 2022-01-08 DIAGNOSIS — Z1231 Encounter for screening mammogram for malignant neoplasm of breast: Secondary | ICD-10-CM

## 2022-01-16 ENCOUNTER — Ambulatory Visit
Admission: RE | Admit: 2022-01-16 | Discharge: 2022-01-16 | Disposition: A | Discharge status: Home / Self Care | Payer: PPO | Source: Ambulatory Visit

## 2022-01-16 DIAGNOSIS — Z1231 Encounter for screening mammogram for malignant neoplasm of breast: Secondary | ICD-10-CM | POA: Diagnosis not present

## 2022-02-02 ENCOUNTER — Other Ambulatory Visit: Payer: Self-pay | Admitting: Family Medicine

## 2022-02-02 DIAGNOSIS — E7800 Pure hypercholesterolemia, unspecified: Secondary | ICD-10-CM

## 2022-03-05 ENCOUNTER — Encounter: Payer: Self-pay | Admitting: Family Medicine

## 2022-03-27 ENCOUNTER — Encounter: Payer: Self-pay | Admitting: Family Medicine

## 2022-05-16 ENCOUNTER — Encounter: Payer: Self-pay | Admitting: Internal Medicine

## 2022-06-05 ENCOUNTER — Encounter: Payer: Self-pay | Admitting: Family Medicine

## 2022-06-11 ENCOUNTER — Other Ambulatory Visit: Payer: Self-pay | Admitting: Family Medicine

## 2022-06-11 DIAGNOSIS — E7800 Pure hypercholesterolemia, unspecified: Secondary | ICD-10-CM

## 2022-11-06 DIAGNOSIS — H25013 Cortical age-related cataract, bilateral: Secondary | ICD-10-CM | POA: Diagnosis not present

## 2022-11-06 DIAGNOSIS — H2511 Age-related nuclear cataract, right eye: Secondary | ICD-10-CM | POA: Diagnosis not present

## 2022-11-06 DIAGNOSIS — H25043 Posterior subcapsular polar age-related cataract, bilateral: Secondary | ICD-10-CM | POA: Diagnosis not present

## 2022-11-06 DIAGNOSIS — H35371 Puckering of macula, right eye: Secondary | ICD-10-CM | POA: Diagnosis not present

## 2022-11-06 DIAGNOSIS — H2513 Age-related nuclear cataract, bilateral: Secondary | ICD-10-CM | POA: Diagnosis not present

## 2022-11-06 DIAGNOSIS — H18413 Arcus senilis, bilateral: Secondary | ICD-10-CM | POA: Diagnosis not present

## 2022-12-02 NOTE — Progress Notes (Unsigned)
No chief complaint on file.   Olivia Cunningham is a 72 y.o. female who presents for a complete physical and Medicare annual wellness visit.   Hyperlipidemia follow-up: She continues to take 40mg  of simvastatin daily, without side effects.  Her TG and LDL were a little higher last year, related to some changes in diet ("clutter and chaos") related to packing/moving.   Diet is back to her usual diet. She is due for recheck. Lab Results  Component Value Date   CHOL 218 (H) 11/27/2021   HDL 56 11/27/2021   LDLCALC 133 (H) 11/27/2021   TRIG 163 (H) 11/27/2021   CHOLHDL 3.9 11/27/2021    Depression/Anxiety: She decreased zoloft dose back to 50mg  after she retired. Since then she has further cut back to 1/2 tablet twice a week, full the other days.  She had to bump back up to 50mg  daiily at one point in the past (Thanksgiving 2021), but was able to get the dose back down.  She continues on the slightly lower dose.   UPDATE Denies any significant anxiety.    Hypothyroidism: She takes her thyroid medication when she first wakes up, separate from food and other medications/vitamins. Takes zoloft 45 minutes later, and MVI at dinnertime.  She denies any symptoms--just mild hair thinning that she relates to menopause, unchanged. No skin/bowel changes, weight, energy or mood changes. She is due for recheck today. Not aware of any recent missed doses. Lab Results  Component Value Date   TSH 1.570 11/27/2021    Immunization History  Administered Date(s) Administered   Covid-19 Iv Non-us Vaccine (Bibp, [employer]opharm) 06/05/2022   Hepatitis A 11/06/2010   Influenza, High Dose Seasonal PF 05/07/2016, 05/27/2019, 06/20/2020, 06/05/2022   Influenza,inj,Quad PF,6+ Mos 09/21/2015   Influenza-Unspecified 04/10/2018, 06/07/2021   PFIZER(Purple Top)SARS-COV-2 Vaccination 10/15/2019, 11/10/2019, 06/06/2020, 12/19/2020   Pfizer Covid-19 Vaccine Bivalent Booster 53yrs & up 06/07/2021   Pneumococcal Conjugate-13  12/26/2016   Pneumococcal Polysaccharide-23 02/20/2018   Td 01/08/2006   Tdap 12/09/2009, 03/27/2022   Zoster Recombinat (Shingrix) 06/20/2020, 08/16/2020   Zoster, Live 09/21/2015   (got [facility] HepA [facility] de[facility], 10/2010 given here)  Last Pap smear: 12/2016, normal, no high risk HPV Last mammogram: 01/2022 Last colonoscopy: 05/2018 with Dr. Collene Mares.  +adenomatous polyps, repeat 5 years. Last DEXA: 09/2017: T-1.4 L fem neck Dentist: every 9 months Ophtho: yearly Exercise:   Limited exercise after COVID in December (due to fatigue), and since January as been busy working on packing/selling home.  Up and down her steps a lot.  Walking some, less often. Prior to December, had been walking 4-5x/week. Getting some weight-bearing exercise with moving.   Vitamin D-OH level 40 in 02/2013  Patient Care Team: Rita Ohara, MD as PCP - General (Family Medicine) Juanita Craver, MD as Consulting Physician (Gastroenterology) Rutherford Guys, MD as Consulting Physician (Ophthalmology) Paulla Dolly Tamala Fothergill, DPM as Consulting Physician (Podiatry) Daryll Brod, MD as Consulting Physician (Orthopedic Surgery) Dr. Mariea Clonts as Consulting Physician (Dentistry) ENT: Dr. Cresenciano Lick (no longer sees) Ortho: Prev saw Dr. Alphonzo Cruise and [facility] at [facility]  Depression Screening: [facility] Office Visit from 11/27/2021 in [place]  PHQ-2 Total Score 0        Falls screen:     11/27/2021    9:49 AM 11/27/2021    8:41 AM 10/24/2020    8:44 AM 08/31/2019    1:29 PM 04/08/2019    5:26 PM  [place] in the past year? 0 0 0  0 0  Comment     Emmi Telephone Survey: data to providers prior to load  Number falls in past yr: 0 0     Injury with Fall? 0 0     Risk for fall due to : No Fall Risks No Fall Risks     Follow up Falls evaluation completed Falls evaluation completed        Functional Status Survey:         End of Life Discussion:  Patient has a living will and medical power of attorney, scanned into  chart.   PMH, PSH, SH and FH reviewed, updated    ROS:  The patient denies anorexia, fever, weight changes, headaches,  vision changes, decreased hearing, ear pain, sore throat, breast concerns, chest pain, palpitations, dizziness, syncope, dyspnea on exertion, cough, swelling, nausea, vomiting, diarrhea, constipation, abdominal pain, melena, hematochezia, hematuria, incontinence, dysuria, vaginal bleeding, discharge, odor or itch, genital lesions, numbness, tingling, weakness, tremor, suspicious skin lesions, depression, anxiety, abnormal bleeding/bruising, or enlarged lymph nodes.  Rare heartburn, no dysphagia.  Some pain down the back of the R leg if she sleeps on her back. She has some congestion in the mornings (sneezing, runny nose). Denies any cough. Slight crusting in the mornings, slight creamy drainage in the mornings only. No itching. Has been told she snores, denies unrefreshed sleep.   PHYSICAL EXAM:  There were no vitals taken for this visit.  Wt Readings from Last 3 Encounters:  11/27/21 141 lb 9.6 oz (64.2 kg)  09/06/21 144 lb (65.3 kg)  04/04/21 144 lb 10 oz (65.6 kg)    General Appearance:     Alert, cooperative, no distress, appears stated age   Head:     Normocephalic, without obvious abnormality, atraumatic   Eyes:     PERRL, conjunctiva/corneas clear, EOM's intact, fundi benign   Ears:     Normal TM's and external ear canals.  Nose:    Not examined ,wearing mask due to COVID-19 pandemic  Throat:    Not examined, wearing mask due to COVID-19 pandemic   Neck:    Supple, no lymphadenopathy;  thyroid: not enlarged, no tenderness/nodules; no carotid bruit or JVD   Back:     Spine nontender, no curvature, ROM normal, no CVA tenderness   Lungs:      Clear to auscultation bilaterally without wheezes, rales or ronchi; respirations unlabored   Chest Wall:     No tenderness or deformity    Heart:     Regular rate and rhythm, S1 and S2 normal, no murmur, rub or gallop    Breast Exam:     No masses, nipple discharge or inversion.  She has mild fibroglandular changes noted bilaterally in UOQ's.  She continues to have a more prominent fibroglandular area in the right UOQ (10 o'clock), with a focal ridge of tissue. This is unchanged from last year, minimally tender. No axillary lymphadenopathy   Abdomen:      Soft, non-tender, nondistended, normoactive bowel sounds, no masses, no hepatosplenomegaly   Genitalia:     Normal external genitalia without lesions; atrophic changes noted.  BUS and vagina normal; no cervical motion tenderness. No abnormal vaginal discharge.  Uterus and adnexa not enlarged, nontender, no masses.  Pap not performed   Rectal:     Normal tone, no masses or tenderness; guaiac negative stool. Nontender external hemorrhoid  Extremities:    No clubbing, cyanosis or edema.   Pulses:    2+ and symmetric all extremities  Skin:    Skin color, texture, turgor normal, no rashes or lesions.   Lymph nodes:    Cervical, supraclavicular, inguinal and axillary nodes normal   Neurologic:    Normal strength, sensation and gait; reflexes 2+ and symmetric throughout                       Psych:   Normal mood, affect, hygiene and grooming  ***UPDATE breast Wearing mask?   ASSESSMENT/PLAN:   Offer COVID booster Did she get RSV?  Rf thyroid, zoloft, simca  C-met, lipid, TSH No CBC unless sx ORDER DEXA, TBC  Discussed monthly self breast exams and yearly mammograms; at least 30 minutes of aerobic activity at least 5 days/week, weight bearing exercise at least 2x/wk; proper sunscreen use reviewed; healthy diet, including goals of calcium and vitamin D intake and alcohol recommendations (less than or equal to 1 drink/day) reviewed; regular seatbelt use; changing batteries in smoke detectors.  Immunization recommendations discussed--continue yearly flu shots.   COVID booster RSV Plan for [id number] next year (<5 years from last pneumonia vaccine  currently). She declines further paps (concordant with guidelines/age). Colonoscopy is due 05/2023 with Dr. Collene Mares DEXA due now, can do with next mammo.  MOST form reviewed/updated.  Full Code, Full Care  F/u 1 year, sooner prn.   Medicare Attestation I have personally reviewed: The patient's medical and social history Their use of alcohol, tobacco or illicit drugs Their current medications and supplements The patient's functional ability including ADLs,fall risks, home safety risks, cognitive, and hearing and visual impairment Diet and physical activities Evidence for depression or mood disorders

## 2022-12-02 NOTE — Patient Instructions (Incomplete)
HEALTH MAINTENANCE RECOMMENDATIONS:  It is recommended that you get at least 30 minutes of aerobic exercise at least 5 days/week (for weight loss, you may need as much as 60-90 minutes). This can be any activity that gets your heart rate up. This can be divided in 10-15 minute intervals if needed, but try and build up your endurance at least once a week.  Weight bearing exercise is also recommended twice weekly.  Eat a healthy diet with lots of vegetables, fruits and fiber.  "Colorful" foods have a lot of vitamins (ie green vegetables, tomatoes, red peppers, etc).  Limit sweet tea, regular sodas and alcoholic beverages, all of which has a lot of calories and sugar.  Up to 1 alcoholic drink daily may be beneficial for women (unless trying to lose weight, watch sugars).  Drink a lot of water.  Calcium recommendations are 1200-1500 mg daily (1500 mg for postmenopausal women or women without ovaries), and vitamin D 1000 IU daily.  This should be obtained from diet and/or supplements (vitamins), and calcium should not be taken all at once, but in divided doses.  Monthly self breast exams and yearly mammograms for women over the age of 18 is recommended.  Sunscreen of at least SPF 30 should be used on all sun-exposed parts of the skin when outside between the hours of 10 am and 4 pm (not just when at beach or pool, but even with exercise, golf, tennis, and yard work!)  Use a sunscreen that says "broad spectrum" so it covers both UVA and UVB rays, and make sure to reapply every 1-2 hours.  Remember to change the batteries in your smoke detectors when changing your clock times in the spring and fall. Carbon monoxide detectors are recommended for your home.  Use your seat belt every time you are in a car, and please drive safely and not be distracted with cell phones and texting while driving.   Olivia Cunningham , Thank you for taking time to come for your Medicare Wellness Visit. I appreciate your ongoing  commitment to your health goals. Please review the following plan we discussed and let me know if I can assist you in the future.   This is a list of the screening recommended for you and due dates:  Health Maintenance  Topic Date Due   COVID-19 Vaccine (7 - 2023-24 season) 07/31/2022   Mammogram  01/17/2023   Colon Cancer Screening  06/03/2023   Medicare Annual Wellness Visit  12/04/2023   DTaP/Tdap/Td vaccine (4 - Td or Tdap) 03/27/2032   Pneumonia Vaccine  Completed   Flu Shot  Completed   DEXA scan (bone density measurement)  Completed   Hepatitis C Screening: USPSTF Recommendation to screen - Ages 20-79 yo.  Completed   Zoster (Shingles) Vaccine  Completed   HPV Vaccine  Aged Out   I recommend getting another bone density test now.  Please call the [facility] to schedule this (can try and do at the same time as your mammogram, if that is more convenient for you).  I recommend getting the RSV vaccine in the Fall, along with your flu shot.  You need to get the RSV vaccine from the pharmacy.  If you choose to get the flu shot at our office, you should separate these by 2 weeks.  Re-try the coenzyme Q10 and turmeric (along with drinking plenty of water and wearing supportive shoes when standing for long periods). Hopefully these will help with the aches and pains.  Resume your core exercises.  Get some weight-bearing exercise.

## 2022-12-04 ENCOUNTER — Ambulatory Visit (INDEPENDENT_AMBULATORY_CARE_PROVIDER_SITE_OTHER): Payer: PPO | Admitting: Family Medicine

## 2022-12-04 ENCOUNTER — Encounter: Payer: Self-pay | Admitting: Family Medicine

## 2022-12-04 VITALS — BP 110/60 | HR 60 | Ht 64.0 in | Wt 143.8 lb

## 2022-12-04 DIAGNOSIS — Z23 Encounter for immunization: Secondary | ICD-10-CM | POA: Diagnosis not present

## 2022-12-04 DIAGNOSIS — Z0000 Encounter for general adult medical examination without abnormal findings: Secondary | ICD-10-CM

## 2022-12-04 DIAGNOSIS — F325 Major depressive disorder, single episode, in full remission: Secondary | ICD-10-CM

## 2022-12-04 DIAGNOSIS — E039 Hypothyroidism, unspecified: Secondary | ICD-10-CM

## 2022-12-04 DIAGNOSIS — E7800 Pure hypercholesterolemia, unspecified: Secondary | ICD-10-CM | POA: Diagnosis not present

## 2022-12-04 DIAGNOSIS — M85852 Other specified disorders of bone density and structure, left thigh: Secondary | ICD-10-CM

## 2022-12-04 DIAGNOSIS — Z5181 Encounter for therapeutic drug level monitoring: Secondary | ICD-10-CM

## 2022-12-04 DIAGNOSIS — F411 Generalized anxiety disorder: Secondary | ICD-10-CM | POA: Diagnosis not present

## 2022-12-04 MED ORDER — SERTRALINE HCL 50 MG PO TABS: 50.0000 mg | ORAL_TABLET | Freq: Every day | ORAL | 3 refills | Status: DC

## 2022-12-05 ENCOUNTER — Encounter: Payer: Self-pay | Admitting: Family Medicine

## 2022-12-05 ENCOUNTER — Other Ambulatory Visit: Payer: Self-pay | Admitting: *Deleted

## 2022-12-05 DIAGNOSIS — E7800 Pure hypercholesterolemia, unspecified: Secondary | ICD-10-CM

## 2022-12-05 DIAGNOSIS — Z5181 Encounter for therapeutic drug level monitoring: Secondary | ICD-10-CM

## 2022-12-05 LAB — COMPREHENSIVE METABOLIC PANEL
ALT: 12 IU/L (ref 0–32)
AST: 14 IU/L (ref 0–40)
Albumin/Globulin Ratio: 1.7 (ref 1.2–2.2)
Albumin: 4.2 g/dL (ref 3.8–4.8)
Alkaline Phosphatase: 59 IU/L (ref 44–121)
BUN/Creatinine Ratio: 18 (ref 12–28)
BUN: 17 mg/dL (ref 8–27)
Bilirubin Total: 0.4 mg/dL (ref 0.0–1.2)
CO2: 24 mmol/L (ref 20–29)
Calcium: 9.3 mg/dL (ref 8.7–10.3)
Chloride: 100 mmol/L (ref 96–106)
Creatinine, Ser: 0.97 mg/dL (ref 0.57–1.00)
Globulin, Total: 2.5 g/dL (ref 1.5–4.5)
Glucose: 99 mg/dL (ref 70–99)
Potassium: 5 mmol/L (ref 3.5–5.2)
Sodium: 138 mmol/L (ref 134–144)
Total Protein: 6.7 g/dL (ref 6.0–8.5)
eGFR: 62 mL/min/{1.73_m2} (ref >59)

## 2022-12-05 LAB — LIPID PANEL
Chol/HDL Ratio: 4.2 ratio (ref 0.0–4.4)
Cholesterol, Total: 211 mg/dL — ABNORMAL HIGH (ref 100–199)
HDL: 50 mg/dL (ref >39)
LDL Chol Calc (NIH): 131 mg/dL — ABNORMAL HIGH (ref 0–99)
Triglycerides: 166 mg/dL — ABNORMAL HIGH (ref 0–149)
VLDL Cholesterol Cal: 30 mg/dL (ref 5–40)

## 2022-12-05 LAB — TSH: TSH: 1.68 u[IU]/mL (ref 0.450–4.500)

## 2022-12-05 MED ORDER — ROSUVASTATIN CALCIUM 20 MG PO TABS: 20.0000 mg | ORAL_TABLET | Freq: Every day | ORAL | 2 refills | Status: DC

## 2022-12-05 MED ORDER — LEVOTHYROXINE SODIUM 112 MCG PO TABS: 112.0000 ug | ORAL_TABLET | Freq: Every day | ORAL | 3 refills | Status: DC

## 2022-12-05 NOTE — Telephone Encounter (Signed)
Done

## 2022-12-06 ENCOUNTER — Ambulatory Visit: Payer: PPO | Admitting: Family Medicine

## 2023-01-01 ENCOUNTER — Other Ambulatory Visit: Payer: Self-pay

## 2023-01-01 ENCOUNTER — Encounter: Payer: Self-pay | Admitting: Family Medicine

## 2023-01-01 MED ORDER — ROSUVASTATIN CALCIUM 20 MG PO TABS: 20.0000 mg | ORAL_TABLET | Freq: Every day | ORAL | 0 refills | Status: DC

## 2023-01-03 ENCOUNTER — Encounter: Payer: Self-pay | Admitting: Family Medicine

## 2023-01-03 ENCOUNTER — Encounter: Payer: Self-pay | Admitting: *Deleted

## 2023-01-04 ENCOUNTER — Other Ambulatory Visit: Payer: Self-pay | Admitting: Family Medicine

## 2023-01-04 DIAGNOSIS — Z1231 Encounter for screening mammogram for malignant neoplasm of breast: Secondary | ICD-10-CM

## 2023-01-07 DIAGNOSIS — H52201 Unspecified astigmatism, right eye: Secondary | ICD-10-CM | POA: Diagnosis not present

## 2023-01-07 DIAGNOSIS — H2511 Age-related nuclear cataract, right eye: Secondary | ICD-10-CM | POA: Diagnosis not present

## 2023-01-08 DIAGNOSIS — H2512 Age-related nuclear cataract, left eye: Secondary | ICD-10-CM | POA: Diagnosis not present

## 2023-01-21 DIAGNOSIS — H52202 Unspecified astigmatism, left eye: Secondary | ICD-10-CM | POA: Diagnosis not present

## 2023-01-21 DIAGNOSIS — H2512 Age-related nuclear cataract, left eye: Secondary | ICD-10-CM | POA: Diagnosis not present

## 2023-02-05 DIAGNOSIS — Z1231 Encounter for screening mammogram for malignant neoplasm of breast: Secondary | ICD-10-CM

## 2023-02-06 ENCOUNTER — Ambulatory Visit
Admission: RE | Admit: 2023-02-06 | Discharge: 2023-02-06 | Disposition: A | Discharge status: Home / Self Care | Payer: PPO | Source: Ambulatory Visit | Attending: Family Medicine | Admitting: Family Medicine

## 2023-02-06 DIAGNOSIS — Z1231 Encounter for screening mammogram for malignant neoplasm of breast: Secondary | ICD-10-CM | POA: Diagnosis not present

## 2023-02-08 ENCOUNTER — Other Ambulatory Visit: Payer: Self-pay | Admitting: Family Medicine

## 2023-02-08 DIAGNOSIS — R928 Other abnormal and inconclusive findings on diagnostic imaging of breast: Secondary | ICD-10-CM

## 2023-03-04 ENCOUNTER — Ambulatory Visit: Payer: PPO

## 2023-03-04 ENCOUNTER — Ambulatory Visit
Admission: RE | Admit: 2023-03-04 | Discharge: 2023-03-04 | Disposition: A | Discharge status: Home / Self Care | Payer: PPO | Source: Ambulatory Visit | Attending: Family Medicine | Admitting: Family Medicine

## 2023-03-04 DIAGNOSIS — R928 Other abnormal and inconclusive findings on diagnostic imaging of breast: Secondary | ICD-10-CM

## 2023-05-22 ENCOUNTER — Encounter: Payer: Self-pay | Admitting: *Deleted

## 2023-05-22 ENCOUNTER — Encounter: Payer: Self-pay | Admitting: Family Medicine

## 2023-05-23 DIAGNOSIS — E039 Hypothyroidism, unspecified: Secondary | ICD-10-CM | POA: Diagnosis not present

## 2023-05-23 DIAGNOSIS — K220 Achalasia of cardia: Secondary | ICD-10-CM | POA: Diagnosis not present

## 2023-05-23 DIAGNOSIS — Z1211 Encounter for screening for malignant neoplasm of colon: Secondary | ICD-10-CM | POA: Diagnosis not present

## 2023-05-23 DIAGNOSIS — E782 Mixed hyperlipidemia: Secondary | ICD-10-CM | POA: Diagnosis not present

## 2023-05-23 DIAGNOSIS — Z86010 Personal history of colonic polyps: Secondary | ICD-10-CM | POA: Diagnosis not present

## 2023-05-23 DIAGNOSIS — R1310 Dysphagia, unspecified: Secondary | ICD-10-CM | POA: Diagnosis not present

## 2023-06-06 ENCOUNTER — Encounter: Payer: Self-pay | Admitting: Family Medicine

## 2023-06-10 ENCOUNTER — Other Ambulatory Visit: Payer: PPO

## 2023-06-10 DIAGNOSIS — E7800 Pure hypercholesterolemia, unspecified: Secondary | ICD-10-CM

## 2023-06-10 DIAGNOSIS — Z5181 Encounter for therapeutic drug level monitoring: Secondary | ICD-10-CM | POA: Diagnosis not present

## 2023-06-11 ENCOUNTER — Encounter: Payer: Self-pay | Admitting: Family Medicine

## 2023-06-11 ENCOUNTER — Other Ambulatory Visit: Payer: Self-pay | Admitting: *Deleted

## 2023-06-11 ENCOUNTER — Ambulatory Visit
Admission: RE | Admit: 2023-06-11 | Discharge: 2023-06-11 | Disposition: A | Discharge status: Home / Self Care | Payer: PPO | Source: Ambulatory Visit | Attending: Family Medicine | Admitting: Family Medicine

## 2023-06-11 DIAGNOSIS — M85852 Other specified disorders of bone density and structure, left thigh: Secondary | ICD-10-CM

## 2023-06-11 DIAGNOSIS — N958 Other specified menopausal and perimenopausal disorders: Secondary | ICD-10-CM | POA: Diagnosis not present

## 2023-06-11 DIAGNOSIS — E349 Endocrine disorder, unspecified: Secondary | ICD-10-CM | POA: Diagnosis not present

## 2023-06-11 DIAGNOSIS — M8588 Other specified disorders of bone density and structure, other site: Secondary | ICD-10-CM | POA: Diagnosis not present

## 2023-06-11 LAB — LIPID PANEL
Chol/HDL Ratio: 3.6 {ratio} (ref 0.0–4.4)
Cholesterol, Total: 182 mg/dL (ref 100–199)
HDL: 50 mg/dL (ref >39)
LDL Chol Calc (NIH): 106 mg/dL — ABNORMAL HIGH (ref 0–99)
Triglycerides: 151 mg/dL — ABNORMAL HIGH (ref 0–149)
VLDL Cholesterol Cal: 26 mg/dL (ref 5–40)

## 2023-06-11 LAB — HEPATIC FUNCTION PANEL
ALT: 9 [IU]/L (ref 0–32)
AST: 16 [IU]/L (ref 0–40)
Albumin: 4.3 g/dL (ref 3.8–4.8)
Alkaline Phosphatase: 55 [IU]/L (ref 44–121)
Bilirubin Total: 0.4 mg/dL (ref 0.0–1.2)
Bilirubin, Direct: 0.12 mg/dL (ref 0.00–0.40)
Total Protein: 6.8 g/dL (ref 6.0–8.5)

## 2023-06-11 MED ORDER — ROSUVASTATIN CALCIUM 20 MG PO TABS: 20.0000 mg | ORAL_TABLET | Freq: Every day | ORAL | 1 refills | Status: DC

## 2023-06-13 ENCOUNTER — Other Ambulatory Visit: Payer: Self-pay | Admitting: Family Medicine

## 2023-06-17 ENCOUNTER — Encounter: Payer: Self-pay | Admitting: *Deleted

## 2023-06-17 DIAGNOSIS — K648 Other hemorrhoids: Secondary | ICD-10-CM | POA: Diagnosis not present

## 2023-06-17 DIAGNOSIS — K621 Rectal polyp: Secondary | ICD-10-CM | POA: Diagnosis not present

## 2023-06-17 DIAGNOSIS — Z860100 Personal history of colon polyps, unspecified: Secondary | ICD-10-CM | POA: Diagnosis not present

## 2023-06-17 DIAGNOSIS — Z860101 Personal history of adenomatous and serrated colon polyps: Secondary | ICD-10-CM | POA: Diagnosis not present

## 2023-06-17 DIAGNOSIS — D128 Benign neoplasm of rectum: Secondary | ICD-10-CM | POA: Diagnosis not present

## 2023-06-17 DIAGNOSIS — D125 Benign neoplasm of sigmoid colon: Secondary | ICD-10-CM | POA: Diagnosis not present

## 2023-06-17 DIAGNOSIS — K635 Polyp of colon: Secondary | ICD-10-CM | POA: Diagnosis not present

## 2023-06-17 DIAGNOSIS — D120 Benign neoplasm of cecum: Secondary | ICD-10-CM | POA: Diagnosis not present

## 2023-06-17 DIAGNOSIS — Z1211 Encounter for screening for malignant neoplasm of colon: Secondary | ICD-10-CM | POA: Diagnosis not present

## 2023-06-17 DIAGNOSIS — D124 Benign neoplasm of descending colon: Secondary | ICD-10-CM | POA: Diagnosis not present

## 2023-06-17 LAB — HM COLONOSCOPY

## 2023-06-19 LAB — HM COLONOSCOPY

## 2023-07-24 ENCOUNTER — Encounter: Payer: Self-pay | Admitting: Family Medicine

## 2023-07-25 ENCOUNTER — Telehealth: Payer: PPO | Admitting: Family Medicine

## 2023-07-25 ENCOUNTER — Encounter: Payer: Self-pay | Admitting: Family Medicine

## 2023-07-25 VITALS — BP 119/80 | HR 96 | Temp 97.8°F | Ht 64.0 in | Wt 141.5 lb

## 2023-07-25 DIAGNOSIS — U071 COVID-19: Secondary | ICD-10-CM

## 2023-07-25 NOTE — Patient Instructions (Signed)
We discussed Paxlovid.  If you'd like to start this, let us know.  It needs to be started within 5 days of onset of symptoms.  You would need to stop the rosuvastatin for the 5 days that you take the paxlovid.  Stay well hydrated. You can use the Mucinex fast-max that you have (realizing that you're getting more ingredients than you may need, and that it only lasts for 4-6 hours), or, you can switch to: Mucinex 12 hour, plain with delsym syrup as needed for cough, OR get the combination Mucinex DM 12 hour (and don't use Delsym). You can use decongestants such as sudafed if needed for sinus pain. Let us know if your cough isn't adequately treated with this regimen. We can send in tessalon perles, if needed.  If you develop persistent or worsening fever, worsening cough, shortness of breath, pain with breathing, chest pain, or other concerning symptoms, please contact us.  Current isolation guidelines say to stay at home until you are fever-free for 24 hours (without using any acetaminophen or NSAID such as ibuprofen or aleve), and your respiratory symptoms are significantly better. After leaving isolation, continue to wear a mask for 5 more days.

## 2023-07-25 NOTE — Progress Notes (Signed)
Start time: 9:37 End time: 10:01  Virtual Visit via Video Note  I connected with Olivia Cunningham on 07/25/23 by a video enabled telemedicine application and verified that I am speaking with the correct person using two identifiers.  Location: Patient: home Provider: office   I discussed the limitations of evaluation and management by telemedicine and the availability of in person appointments. The patient expressed understanding and agreed to proceed.  History of Present Illness:  Chief Complaint  Patient presents with   Covid Positive    VIRTUAL positive covid. Tested positive yesterday, symtoms started Tuesday night. Had bad HA, ST and congestion. Last night she had temp 100.4.Temp is gone now. Feels slightly better this morning.    Symptoms started Tuesday night (11/12) with congestion, sore throat and bad headache.  Yesterday felt like a bad cold with a bad headache.  She took Afrin to help with congestion.  T100.4 last night.  This morning she didn't have a fever. She has persistent congestion, slight headache, and some coughing.  Coughing more today than yesterday.  Cough is nonproductive.  Nasal drainage is clear.  Took night-time mucinex on Tuesday. Using Breathe Right strips at night.  Has Mucinex fast-max--with apap, DM, guaifenesin, phenylephrine; hasn't taken any of this yet.  PMH, PSH, SH reviewed  Outpatient Encounter Medications as of 07/25/2023  Medication Sig Note   acetaminophen (TYLENOL) 500 MG tablet Take 500 mg by mouth every 6 (six) hours as needed. 07/25/2023: Last dose 1000mg  last night before bed   levothyroxine (SYNTHROID) 112 MCG tablet Take 1 tablet (112 mcg total) by mouth daily.    Multiple Vitamin (MULTIVITAMIN) tablet Take 1 tablet by mouth daily.    oxymetazoline (AFRIN) 0.05 % nasal spray Place 1 spray into both nostrils 2 (two) times daily. 07/25/2023: Last dose yesterday am   rosuvastatin (CRESTOR) 20 MG tablet Take 1 tablet (20 mg total) by  mouth daily.    sertraline (ZOLOFT) 50 MG tablet Take 1 tablet (50 mg total) by mouth daily.    Ibuprofen (ADVIL) 200 MG CAPS Take 200 mg by mouth as needed. (Patient not taking: Reported on 12/04/2022) 07/25/2023: As needed   Naproxen Sodium (ALEVE PO) Take 220 mg by mouth as needed. (Patient not taking: Reported on 11/27/2021) 07/25/2023: As needed   No facility-administered encounter medications on file as of 07/25/2023.   Allergies  Allergen Reactions   Demerol Nausea And Vomiting   Penicillins Rash   ROS:  URI symptoms per HPI. No chest pain, shortness of breath. No n/v/d No loss of smell or taste, just slightly less   Observations/Objective:  BP 119/80   Pulse 96   Temp 97.8 F (36.6 C) (Oral)   Ht 5\' 4"  (1.626 m)   Wt 141 lb 8 oz (64.2 kg)   BMI 24.29 kg/m   Pleasant, well-appearing female in no distress. Mildly congested, no significant coughing noted during visit. Walking in house without any dyspnea or cough. She is alert and oriented.  Exam is limited due to the virtual nature of the visit.   Assessment and Plan:   COVID-19 virus infection - risks/SE of paxlovid reviewed. Supportive measures, sx of complications reviewed. Isolation/masking recs reviewed  We discussed Paxlovid.  If you'd like to start this, let us know.  It needs to be started within 5 days of onset of symptoms.  You would need to stop the rosuvastatin for the 5 days that you take the paxlovid.  Stay well hydrated. You can use  the Mucinex fast-max that you have (realizing that you're getting more ingredients than you may need, and that it only lasts for 4-6 hours), or, you can switch to: Mucinex 12 hour, plain with delsym syrup as needed for cough, OR get the combination Mucinex DM 12 hour (and don't use Delsym). You can use decongestants such as sudafed if needed for sinus pain. Let us know if your cough isn't adequately treated with this regimen. We can send in tessalon perles, if needed.  If  you develop persistent or worsening fever, worsening cough, shortness of breath, pain with breathing, chest pain, or other concerning symptoms, please contact us.  Current isolation guidelines say to stay at home until you are fever-free for 24 hours (without using any acetaminophen or NSAID such as ibuprofen or aleve), and your respiratory symptoms are significantly better. After leaving isolation, continue to wear a mask for 5 more days.   Follow Up Instructions:    I discussed the assessment and treatment plan with the patient. The patient was provided an opportunity to ask questions and all were answered. The patient agreed with the plan and demonstrated an understanding of the instructions.   The patient was advised to call back or seek an in-person evaluation if the symptoms worsen or if the condition fails to improve as anticipated.  I spent 25 minutes dedicated to the care of this patient, including pre-visit review of records, face to face time, post-visit ordering of testing and documentation.    Lavonda Jumbo, MD

## 2023-07-29 ENCOUNTER — Encounter: Payer: Self-pay | Admitting: Family Medicine

## 2023-07-29 DIAGNOSIS — R051 Acute cough: Secondary | ICD-10-CM

## 2023-07-29 MED ORDER — BENZONATATE 200 MG PO CAPS: 200.0000 mg | ORAL_CAPSULE | Freq: Three times a day (TID) | ORAL | 0 refills | Status: AC | PRN

## 2023-08-02 ENCOUNTER — Encounter: Payer: Self-pay | Admitting: *Deleted

## 2023-10-07 DIAGNOSIS — Z961 Presence of intraocular lens: Secondary | ICD-10-CM | POA: Diagnosis not present

## 2023-10-07 DIAGNOSIS — H524 Presbyopia: Secondary | ICD-10-CM | POA: Diagnosis not present

## 2023-12-15 ENCOUNTER — Encounter: Payer: Self-pay | Admitting: Family Medicine

## 2023-12-15 NOTE — Patient Instructions (Incomplete)
  HEALTH MAINTENANCE RECOMMENDATIONS:  It is recommended that you get at least 30 minutes of aerobic exercise at least 5 days/week (for weight loss, you may need as much as 60-90 minutes). This can be any activity that gets your heart rate up. This can be divided in 10-15 minute intervals if needed, but try and build up your endurance at least once a week.  Weight bearing exercise is also recommended twice weekly.  Eat a healthy diet with lots of vegetables, fruits and fiber.  "Colorful" foods have a lot of vitamins (ie green vegetables, tomatoes, red peppers, etc).  Limit sweet tea, regular sodas and alcoholic beverages, all of which has a lot of calories and sugar.  Up to 1 alcoholic drink daily may be beneficial for women (unless trying to lose weight, watch sugars).  Drink a lot of water.  Calcium recommendations are 1200-1500 mg daily (1500 mg for postmenopausal women or women without ovaries), and vitamin D 1000 IU daily.  This should be obtained from diet and/or supplements (vitamins), and calcium should not be taken all at once, but in divided doses.  Monthly self breast exams and yearly mammograms for women over the age of 87 is recommended.  Sunscreen of at least SPF 30 should be used on all sun-exposed parts of the skin when outside between the hours of 10 am and 4 pm (not just when at beach or pool, but even with exercise, golf, tennis, and yard work!)  Use a sunscreen that says "broad spectrum" so it covers both UVA and UVB rays, and make sure to reapply every 1-2 hours.  Remember to change the batteries in your smoke detectors when changing your clock times in the spring and fall. Carbon monoxide detectors are recommended for your home.  Use your seat belt every time you are in a car, and please drive safely and not be distracted with cell phones and texting while driving.   Ms. Cheese , Thank you for taking time to come for your Medicare Wellness Visit. I appreciate your ongoing  commitment to your health goals. Please review the following plan we discussed and let me know if I can assist you in the future.   This is a list of the screening recommended for you and due dates:  Health Maintenance  Topic Date Due   COVID-19 Vaccine (9 - 2024-25 season) 11/19/2023   Mammogram  02/06/2024   Flu Shot  04/10/2024   Medicare Annual Wellness Visit  12/15/2024   Colon Cancer Screening  06/18/2026   DTaP/Tdap/Td vaccine (4 - Td or Tdap) 03/27/2032   Pneumonia Vaccine  Completed   DEXA scan (bone density measurement)  Completed   Hepatitis C Screening  Completed   Zoster (Shingles) Vaccine  Completed   HPV Vaccine  Aged Out   Consider RSV vaccine in the Fall, vs waiting until the age of 29. Continue flu and COVID boosters in the Fall (technically you could get another one now). We gave you Prevnar-20 today.

## 2023-12-15 NOTE — Progress Notes (Unsigned)
 No chief complaint on file.  Olivia Cunningham is a 73 y.o. female who presents for a complete physical and Medicare annual wellness visit.   Hyperlipidemia follow-up: She was switched from simvastatin 40 mg to rosuvastatin 20 mg after her physical last year. Recheck in 05/2023 was improved. She denies side effects. She denies any changes to her diet.  Component Ref Range & Units (hover) 6 mo ago 1 yr ago 2 yr ago 3 yr ago 4 yr ago 5 yr ago 6 yr ago  Cholesterol, Total 182 211 High  218 High  196 215 High  197   Triglycerides 151 High  166 High  163 High  123 146 107 131 R  HDL 50 50 56 55 57 57 57 R  VLDL Cholesterol Cal 26 30 29 22 26 21    LDL Chol Calc (NIH) 106 High  131 High  133 High  119 High  132 High     Chol/HDL Ratio 3.6 4.2 CM 3.9 CM 3.6 CM 3.8 CM 3.5 CM 3.4 R     Depression/Anxiety: She decreased zoloft dose back to 50mg  after she retired. Since then she has further cut back to 1/2 tablet twice a week, full the other days.  She had to bump back up to 50mg  daily at one point in the past (Thanksgiving 2021), but was able to get the dose back down.  She continues on the slightly lower dose.  Last year she reported stressors related to the war in [place], recent family losses but felt like she was managing okay. Today she reports *** Current stressors ***    Hypothyroidism: She takes her thyroid medication when she first wakes up, separate from food and other medications/vitamins. Takes zoloft 45 minutes later, and MVI at dinnertime.  She denies any symptoms--just mild hair thinning that she relates to menopause, unchanged. No skin/bowel changes, weight, energy or mood changes. She is due for recheck today. Not aware of any recent missed doses. She has been at goal on current dose of 112 mcg levothyroxine.  Osteopenia:  She had DEXA repeated in 06/2023. This showed some decline at R fem neck from prior study in 2019.  T-1.7.  Calcium Vitamin D Last Vitamin D-OH level was normal at 40  in 02/2013 Weight-bearing exercise?  ***UPDATE  Immunization History  Administered Date(s) Administered   Covid-19 Iv Non-us Vaccine (Bibp, Sinopharm) 06/05/2022   Fluad Quad(high Dose 65+) 05/22/2023   Hepatitis A 11/06/2010   Influenza, High Dose Seasonal PF 05/07/2016, 05/27/2019, 06/20/2020, 06/05/2022   Influenza,inj,Quad PF,6+ Mos 09/21/2015   Influenza-Unspecified 04/10/2018, 06/07/2021   PFIZER(Purple Top)SARS-COV-2 Vaccination 10/15/2019, 11/10/2019, 06/06/2020, 12/19/2020   Pfizer Covid-19 Vaccine Bivalent Booster 74yrs & up 06/07/2021   Pfizer(Comirnaty)Fall Seasonal Vaccine 12 years and older 12/04/2022, 05/22/2023   Pneumococcal Conjugate-13 12/26/2016   Pneumococcal Polysaccharide-23 02/20/2018   Td 01/08/2006   Tdap 12/09/2009, 03/27/2022   Zoster Recombinant(Shingrix) 06/20/2020, 08/16/2020   Zoster, Live 09/21/2015   (got [facility] HepA [facility], 10/2010 given here)  Last Pap smear: 12/2016, normal, no high risk HPV. Declines further Last mammogram: 01/2023 (had recall on R, was normal) Last colonoscopy: 06/2023 with Dr. Zollie Scale polyps (3 tubular adenomas and an SSP), internal hemorrhoids. 3 yr f/u recommended. Last DEXA: 06/2023: T-1.7 R fem neck (decrease from -1.3 in 09/2017). Dentist: every 9 months Ophtho: yearly Exercise:    Walks 4-5x/week for 25-40 minutes. No weight-bearing exercise (hasn't found her weights yet).   Vitamin D-OH level 40 in 02/2013  Patient Care Team: Joselyn Arrow, MD as PCP - General (Family Medicine) Charna Elizabeth, MD as Consulting Physician (Gastroenterology) Jethro Bolus, MD as Consulting Physician (Ophthalmology) Charlsie Merles Kirstie Peri, DPM as Consulting Physician (Podiatry) Cindee Salt, MD as Consulting Physician (Orthopedic Surgery) Dr. Sharma Covert as Consulting Physician (Dentistry) Mia Creek, MD as Consulting Physician (Ophthalmology) ENT: Dr. Jac Canavan (no longer sees)  Depression Screening: Flowsheet Row Office Visit from 12/04/2022  in [facility]  PHQ-2 Total Score 0        Falls screen:     12/04/2022    8:33 AM 11/27/2021    9:49 AM 11/27/2021    8:41 AM 10/24/2020    8:44 AM 08/31/2019    1:29 PM  Fall Risk   Falls in the past year? 0 0 0 0 0  Number falls in past yr: 0 0 0    Injury with Fall? 0 0 0    Risk for fall due to : No Fall Risks No Fall Risks No Fall Risks    Follow up Falls evaluation completed Falls evaluation completed Falls evaluation completed       Functional Status Survey:         End of Life Discussion:  Patient has a living will and medical power of attorney, scanned into chart.   PMH, PSH, SH and FH reviewed, updated     ROS:  The patient denies anorexia, fever, weight changes, headaches,  vision changes, decreased hearing, ear pain, sore throat, breast concerns, chest pain, palpitations, dizziness, syncope, dyspnea on exertion, cough, swelling, nausea, vomiting, diarrhea, constipation, abdominal pain, melena, hematochezia, hematuria, incontinence, dysuria, vaginal bleeding, discharge, odor or itch, genital lesions, numbness, tingling, weakness, tremor, suspicious skin lesions, depression, anxiety, abnormal bleeding/bruising, or enlarged lymph nodes.  Rare heartburn, no dysphagia.  Some bilateral lower leg pain at night, sporadically, per HPI. Some low back pain with standing. Mild allergies, watery eyes (taking regular drops). Has been told she snores, denies unrefreshed sleep.   PHYSICAL EXAM:  There were no vitals taken for this visit.  Wt Readings from Last 3 Encounters:  07/25/23 141 lb 8 oz (64.2 kg)  12/04/22 143 lb 12.8 oz (65.2 kg)  11/27/21 141 lb 9.6 oz (64.2 kg)    General Appearance:     Alert, cooperative, no distress, appears stated age   Head:     Normocephalic, without obvious abnormality, atraumatic   Eyes:     PERRL, conjunctiva/corneas clear, EOM's intact, fundi benign   Ears:     Normal TM's and external ear canals.  Nose:    No  drainage or sinus tenderness  Throat:    Normal mucosa  Neck:    Supple, no lymphadenopathy;  thyroid: not enlarged, no tenderness/nodules; no carotid bruit or JVD   Back:     Spine nontender, no curvature, ROM normal, no CVA tenderness   Lungs:      Clear to auscultation bilaterally without wheezes, rales or ronchi; respirations unlabored   Chest Wall:     No tenderness or deformity    Heart:     Regular rate and rhythm, S1 and S2 normal, no murmur, rub or gallop   Breast Exam:     No masses, nipple discharge or inversion.  She has mild fibroglandular changes noted bilaterally in UOQ's.  She continues to have a more prominent fibroglandular area in the right UOQ (10 o'clock), with a focal ridge of tissue. This is unchanged from last year, nontender. No axillary lymphadenopathy  Abdomen:      Soft, non-tender, nondistended, normoactive bowel sounds, no masses, no hepatosplenomegaly   Genitalia:     Normal external genitalia without lesions; atrophic changes noted.  BUS and vagina normal; no cervical motion tenderness. No abnormal vaginal discharge.  Uterus and adnexa not enlarged, nontender, no masses.  Pap not performed   Rectal:     Normal tone, no masses or tenderness; guaiac negative stool. Nontender external hemorrhoid  Extremities:    No clubbing, cyanosis or edema.   Pulses:    2+ and symmetric all extremities   Skin:    Skin color, texture, turgor normal, no rashes or lesions.   Lymph nodes:    Cervical, supraclavicular, inguinal and axillary nodes normal   Neurologic:    Normal strength, sensation and gait; reflexes 2+ and symmetric throughout                       Psych:   Normal mood, affect, hygiene and grooming   ASSESSMENT/PLAN:  Remember to do Mini-cog and all other medicare screenings. PHQ-9, GAD-7 Rec Prevnar-20  Consider COVID from pharmacy (DO WE HAVE ANY LEFT IN THE OFFICE?)    Discuss RSV vaccine--if she hasn't had yet, get can in the Fall, or wait until age  68.  ***REFILL THYROID AND STATIN AFTER LABS BACK   Discussed monthly self breast exams and yearly mammograms; at least 30 minutes of aerobic activity at least 5 days/week, weight bearing exercise at least 2x/wk; proper sunscreen use reviewed; healthy diet, including goals of calcium and vitamin D intake and alcohol recommendations (less than or equal to 1 drink/day) reviewed; regular seatbelt use; changing batteries in smoke detectors.  Immunization recommendations discussed--continue yearly flu shots.   Prevnar-20 *** COVID booster *** She declines further paps (concordant with guidelines/age). Colonoscopy, UTD, due again in 06/2026 with Dr. Loreta Ave   MOST form reviewed/updated.  Full Code, Full Care  ***Last year: She was hesitant, and reported that "at some point" she will want to be DNR. Agrees to Full care after various scenarios discussed.  F/u 1 year, sooner prn.   Medicare Attestation I have personally reviewed: The patient's medical and social history Their use of alcohol, tobacco or illicit drugs Their current medications and supplements The patient's functional ability including ADLs,fall risks, home safety risks, cognitive, and hearing and visual impairment Diet and physical activities Evidence for depression or mood disorders

## 2023-12-16 ENCOUNTER — Encounter: Payer: Self-pay | Admitting: Family Medicine

## 2023-12-16 ENCOUNTER — Ambulatory Visit (INDEPENDENT_AMBULATORY_CARE_PROVIDER_SITE_OTHER): Payer: PPO | Admitting: Family Medicine

## 2023-12-16 VITALS — BP 120/82 | HR 80 | Ht 65.0 in | Wt 146.4 lb

## 2023-12-16 DIAGNOSIS — Z5181 Encounter for therapeutic drug level monitoring: Secondary | ICD-10-CM | POA: Diagnosis not present

## 2023-12-16 DIAGNOSIS — M85852 Other specified disorders of bone density and structure, left thigh: Secondary | ICD-10-CM

## 2023-12-16 DIAGNOSIS — F411 Generalized anxiety disorder: Secondary | ICD-10-CM

## 2023-12-16 DIAGNOSIS — F325 Major depressive disorder, single episode, in full remission: Secondary | ICD-10-CM | POA: Diagnosis not present

## 2023-12-16 DIAGNOSIS — Z0000 Encounter for general adult medical examination without abnormal findings: Secondary | ICD-10-CM | POA: Diagnosis not present

## 2023-12-16 DIAGNOSIS — E7800 Pure hypercholesterolemia, unspecified: Secondary | ICD-10-CM

## 2023-12-16 DIAGNOSIS — Z23 Encounter for immunization: Secondary | ICD-10-CM | POA: Diagnosis not present

## 2023-12-16 DIAGNOSIS — E039 Hypothyroidism, unspecified: Secondary | ICD-10-CM | POA: Diagnosis not present

## 2023-12-16 LAB — LIPID PANEL

## 2023-12-16 MED ORDER — SERTRALINE HCL 50 MG PO TABS
50.0000 mg | ORAL_TABLET | Freq: Every day | ORAL | 3 refills | Status: AC
Start: 2023-12-16 — End: ?

## 2023-12-17 ENCOUNTER — Encounter: Payer: Self-pay | Admitting: Family Medicine

## 2023-12-17 LAB — CMP14+EGFR
ALT: 14 IU/L (ref 0–32)
AST: 18 IU/L (ref 0–40)
Albumin: 4.2 g/dL (ref 3.8–4.8)
Alkaline Phosphatase: 55 IU/L (ref 44–121)
BUN/Creatinine Ratio: 20 (ref 12–28)
BUN: 18 mg/dL (ref 8–27)
Bilirubin Total: 0.4 mg/dL (ref 0.0–1.2)
CO2: 24 mmol/L (ref 20–29)
Calcium: 9.2 mg/dL (ref 8.7–10.3)
Chloride: 101 mmol/L (ref 96–106)
Creatinine, Ser: 0.91 mg/dL (ref 0.57–1.00)
Globulin, Total: 2.7 g/dL (ref 1.5–4.5)
Glucose: 88 mg/dL (ref 70–99)
Potassium: 4.5 mmol/L (ref 3.5–5.2)
Sodium: 138 mmol/L (ref 134–144)
Total Protein: 6.9 g/dL (ref 6.0–8.5)
eGFR: 67 mL/min/{1.73_m2} (ref >59)

## 2023-12-17 LAB — LIPID PANEL
Cholesterol, Total: 183 mg/dL (ref 100–199)
HDL: 53 mg/dL (ref >39)
LDL CALC COMMENT:: 3.5 ratio (ref 0.0–4.4)
LDL Chol Calc (NIH): 100 mg/dL — ABNORMAL HIGH (ref 0–99)
Triglycerides: 174 mg/dL — ABNORMAL HIGH (ref 0–149)
VLDL Cholesterol Cal: 30 mg/dL (ref 5–40)

## 2023-12-17 LAB — VITAMIN D 25 HYDROXY (VIT D DEFICIENCY, FRACTURES): Vit D, 25-Hydroxy: 54.4 ng/mL (ref 30.0–100.0)

## 2023-12-17 LAB — TSH: TSH: 1.19 u[IU]/mL (ref 0.450–4.500)

## 2023-12-17 MED ORDER — LEVOTHYROXINE SODIUM 112 MCG PO TABS
112.0000 ug | ORAL_TABLET | Freq: Every day | ORAL | 3 refills | Status: AC
Start: 2023-12-17 — End: ?

## 2023-12-17 MED ORDER — ROSUVASTATIN CALCIUM 20 MG PO TABS
20.0000 mg | ORAL_TABLET | Freq: Every day | ORAL | 3 refills | Status: AC
Start: 2023-12-17 — End: ?

## 2024-03-20 ENCOUNTER — Other Ambulatory Visit: Payer: Self-pay | Admitting: Family Medicine

## 2024-03-20 DIAGNOSIS — Z1231 Encounter for screening mammogram for malignant neoplasm of breast: Secondary | ICD-10-CM

## 2024-03-26 ENCOUNTER — Inpatient Hospital Stay
Admission: RE | Admit: 2024-03-26 | Discharge: 2024-03-26 | Discharge status: Home / Self Care | Source: Ambulatory Visit

## 2024-03-26 DIAGNOSIS — Z1231 Encounter for screening mammogram for malignant neoplasm of breast: Secondary | ICD-10-CM

## 2024-06-12 ENCOUNTER — Encounter: Payer: Self-pay | Admitting: Family Medicine

## 2025-01-04 ENCOUNTER — Ambulatory Visit: Payer: Self-pay | Admitting: Family Medicine
